# Patient Record
Sex: Female | Born: 1965
Health system: Southern US, Community
[De-identification: ages and names within clinical notes are randomized; demographics above are authoritative.]

## PROBLEM LIST (undated history)

## (undated) DIAGNOSIS — G2581 Restless legs syndrome: Secondary | ICD-10-CM

## (undated) DIAGNOSIS — M1990 Unspecified osteoarthritis, unspecified site: Secondary | ICD-10-CM

## (undated) DIAGNOSIS — C801 Malignant (primary) neoplasm, unspecified: Secondary | ICD-10-CM

## (undated) DIAGNOSIS — F419 Anxiety disorder, unspecified: Secondary | ICD-10-CM

## (undated) HISTORY — PX: ABDOMINAL HYSTERECTOMY: SHX81

## (undated) HISTORY — PX: TONSILLECTOMY: SUR1361

---

## 1997-12-15 ENCOUNTER — Other Ambulatory Visit: Admission: RE | Admit: 1997-12-15 | Discharge: 1997-12-15 | Payer: Self-pay | Admitting: *Deleted

## 1998-07-26 ENCOUNTER — Ambulatory Visit (HOSPITAL_COMMUNITY): Admission: RE | Admit: 1998-07-26 | Discharge: 1998-07-26 | Payer: Self-pay | Admitting: *Deleted

## 1998-07-26 ENCOUNTER — Encounter: Payer: Self-pay | Admitting: *Deleted

## 1998-08-10 ENCOUNTER — Ambulatory Visit (HOSPITAL_COMMUNITY): Admission: RE | Admit: 1998-08-10 | Discharge: 1998-08-10 | Payer: Self-pay | Admitting: *Deleted

## 1998-08-10 ENCOUNTER — Encounter: Payer: Self-pay | Admitting: *Deleted

## 1998-08-20 ENCOUNTER — Inpatient Hospital Stay (HOSPITAL_COMMUNITY): Admission: AD | Admit: 1998-08-20 | Discharge: 1998-08-20 | Payer: Self-pay | Admitting: Obstetrics and Gynecology

## 1998-08-29 ENCOUNTER — Emergency Department (HOSPITAL_COMMUNITY): Admission: EM | Admit: 1998-08-29 | Discharge: 1998-08-29 | Payer: Self-pay | Admitting: Emergency Medicine

## 1998-09-10 ENCOUNTER — Other Ambulatory Visit: Admission: RE | Admit: 1998-09-10 | Discharge: 1998-09-10 | Payer: Self-pay | Admitting: Obstetrics and Gynecology

## 1999-03-01 ENCOUNTER — Inpatient Hospital Stay (HOSPITAL_COMMUNITY): Admission: AD | Admit: 1999-03-01 | Discharge: 1999-03-01 | Payer: Self-pay | Admitting: Obstetrics and Gynecology

## 1999-03-04 ENCOUNTER — Inpatient Hospital Stay (HOSPITAL_COMMUNITY): Admission: AD | Admit: 1999-03-04 | Discharge: 1999-03-08 | Payer: Self-pay | Admitting: Obstetrics & Gynecology

## 1999-03-05 ENCOUNTER — Encounter: Payer: Self-pay | Admitting: Obstetrics & Gynecology

## 1999-03-10 ENCOUNTER — Inpatient Hospital Stay (HOSPITAL_COMMUNITY): Admission: AD | Admit: 1999-03-10 | Discharge: 1999-03-10 | Payer: Self-pay | Admitting: Obstetrics and Gynecology

## 1999-03-11 ENCOUNTER — Inpatient Hospital Stay (HOSPITAL_COMMUNITY): Admission: AD | Admit: 1999-03-11 | Discharge: 1999-03-11 | Payer: Self-pay | Admitting: *Deleted

## 1999-04-17 ENCOUNTER — Other Ambulatory Visit: Admission: RE | Admit: 1999-04-17 | Discharge: 1999-04-17 | Payer: Self-pay | Admitting: Obstetrics and Gynecology

## 2000-04-06 ENCOUNTER — Other Ambulatory Visit: Admission: RE | Admit: 2000-04-06 | Discharge: 2000-04-06 | Payer: Self-pay | Admitting: Obstetrics and Gynecology

## 2001-04-16 ENCOUNTER — Other Ambulatory Visit: Admission: RE | Admit: 2001-04-16 | Discharge: 2001-04-16 | Payer: Self-pay | Admitting: Obstetrics and Gynecology

## 2001-05-17 ENCOUNTER — Other Ambulatory Visit: Admission: RE | Admit: 2001-05-17 | Discharge: 2001-05-17 | Payer: Self-pay | Admitting: Obstetrics and Gynecology

## 2002-04-18 ENCOUNTER — Other Ambulatory Visit: Admission: RE | Admit: 2002-04-18 | Discharge: 2002-04-18 | Payer: Self-pay | Admitting: Obstetrics and Gynecology

## 2002-10-27 ENCOUNTER — Encounter: Admission: RE | Admit: 2002-10-27 | Discharge: 2002-10-27 | Payer: Self-pay | Admitting: Family Medicine

## 2002-10-27 ENCOUNTER — Encounter: Payer: Self-pay | Admitting: Family Medicine

## 2003-09-21 ENCOUNTER — Other Ambulatory Visit: Admission: RE | Admit: 2003-09-21 | Discharge: 2003-09-21 | Payer: Self-pay | Admitting: Obstetrics and Gynecology

## 2004-02-25 ENCOUNTER — Emergency Department (HOSPITAL_COMMUNITY): Admission: EM | Admit: 2004-02-25 | Discharge: 2004-02-25 | Payer: Self-pay | Admitting: Family Medicine

## 2004-02-25 ENCOUNTER — Ambulatory Visit (HOSPITAL_COMMUNITY): Admission: RE | Admit: 2004-02-25 | Discharge: 2004-02-25 | Payer: Self-pay | Admitting: Family Medicine

## 2004-03-13 ENCOUNTER — Other Ambulatory Visit: Admission: RE | Admit: 2004-03-13 | Discharge: 2004-03-13 | Payer: Self-pay | Admitting: Obstetrics and Gynecology

## 2004-09-06 ENCOUNTER — Other Ambulatory Visit: Admission: RE | Admit: 2004-09-06 | Discharge: 2004-09-06 | Payer: Self-pay | Admitting: Obstetrics and Gynecology

## 2005-03-11 ENCOUNTER — Other Ambulatory Visit: Admission: RE | Admit: 2005-03-11 | Discharge: 2005-03-11 | Payer: Self-pay | Admitting: Obstetrics and Gynecology

## 2005-10-15 ENCOUNTER — Encounter (INDEPENDENT_AMBULATORY_CARE_PROVIDER_SITE_OTHER): Payer: Self-pay | Admitting: *Deleted

## 2005-10-15 ENCOUNTER — Ambulatory Visit (HOSPITAL_BASED_OUTPATIENT_CLINIC_OR_DEPARTMENT_OTHER): Admission: RE | Admit: 2005-10-15 | Discharge: 2005-10-15 | Payer: Self-pay | Admitting: Obstetrics and Gynecology

## 2005-12-05 ENCOUNTER — Encounter: Admission: RE | Admit: 2005-12-05 | Discharge: 2005-12-05 | Payer: Self-pay | Admitting: Emergency Medicine

## 2007-04-29 ENCOUNTER — Encounter: Admission: RE | Admit: 2007-04-29 | Discharge: 2007-04-29 | Payer: Self-pay | Admitting: Obstetrics and Gynecology

## 2008-03-31 ENCOUNTER — Emergency Department (HOSPITAL_COMMUNITY): Admission: EM | Admit: 2008-03-31 | Discharge: 2008-03-31 | Payer: Self-pay | Admitting: Primary Care

## 2008-03-31 IMAGING — CR DG RIBS 2V*L*
3 series · 3 of 3 positions shown · non-contrast
Comparison: Chest radiograph same date

CLINICAL DATA: Left-sided chest pain

LEFT RIBS - 2 VIEW

[w ribs ap/pa upper left]
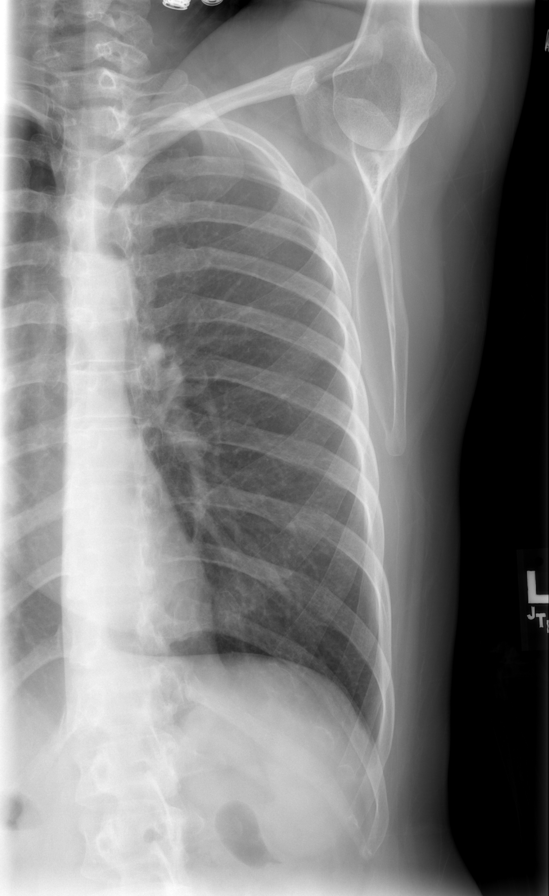

[w ribs ap/pa lower left]
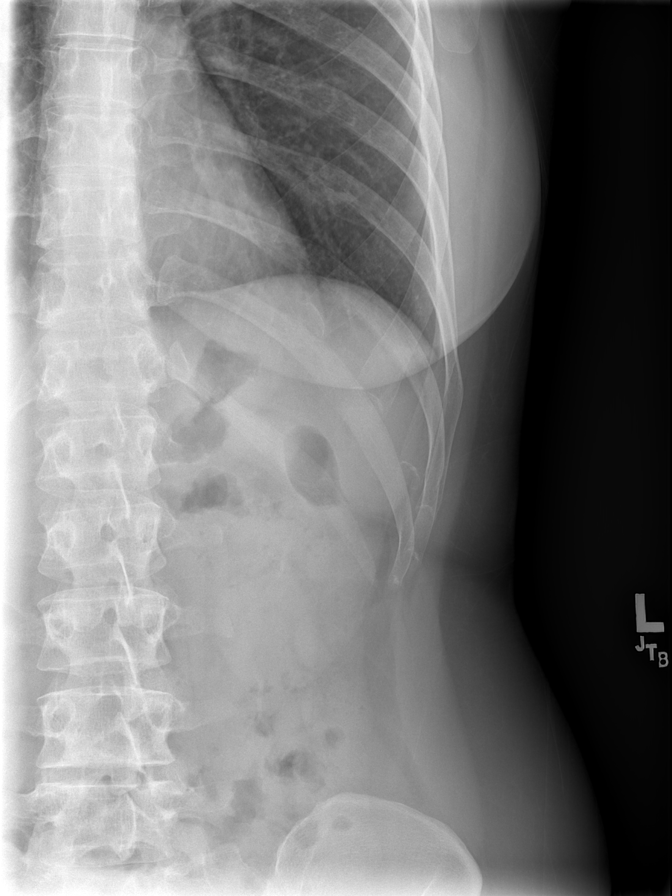

[w ribs oblique left]
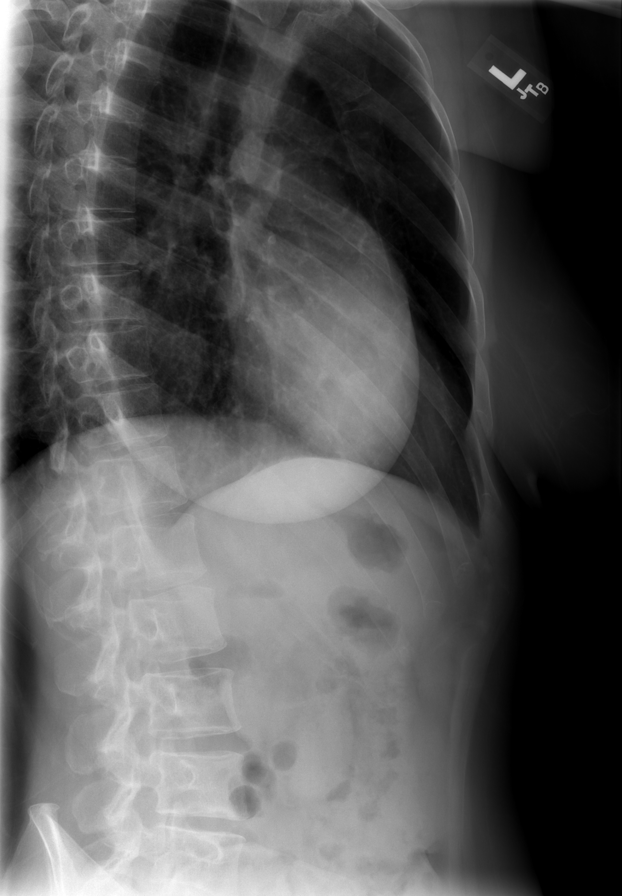

[3 of 3 positions shown; findings below may reference images not displayed]

FINDINGS: The visualized ribs are intact.  No fractures identified.
The lungs are clear.
IMPRESSION: Negative for fracture.

## 2008-08-07 ENCOUNTER — Ambulatory Visit: Payer: Self-pay | Admitting: Internal Medicine

## 2008-08-23 ENCOUNTER — Ambulatory Visit: Payer: Self-pay | Admitting: Gastroenterology

## 2008-08-30 ENCOUNTER — Emergency Department (HOSPITAL_COMMUNITY): Admission: EM | Admit: 2008-08-30 | Discharge: 2008-08-30 | Payer: Self-pay | Admitting: Family Medicine

## 2008-09-15 ENCOUNTER — Emergency Department (HOSPITAL_COMMUNITY): Admission: EM | Admit: 2008-09-15 | Discharge: 2008-09-15 | Payer: Self-pay | Admitting: Emergency Medicine

## 2008-09-21 ENCOUNTER — Ambulatory Visit: Payer: Self-pay | Admitting: Internal Medicine

## 2010-06-02 LAB — POCT RAPID STREP A (OFFICE): Streptococcus, Group A Screen (Direct): NEGATIVE

## 2010-06-11 LAB — D-DIMER, QUANTITATIVE: D-Dimer, Quant: 0.29 ug/mL-FEU (ref 0.00–0.48)

## 2010-07-12 NOTE — Op Note (Signed)
Deborah Martin, Deborah Martin              ACCOUNT NO.:  [id number]   MEDICAL RECORD NO.:  [id number]          PATIENT TYPE:  AMB   LOCATION:  [facility]                         FACILITY:  [facility]   PHYSICIAN:  Malva Limes, M.D.    DATE OF BIRTH:  [birth year]   DATE OF PROCEDURE:  10/15/2005  DATE OF DISCHARGE:                                 OPERATIVE REPORT   PREOPERATIVE DIAGNOSES:  1. Intrauterine pregnancy at 6.5 weeks estimated gestational age.  2. The patient desires termination.   POSTOPERATIVE DIAGNOSES:  1. Intrauterine pregnancy at 6.5 weeks estimated gestational age.  2. The patient desires termination.   PROCEDURE:  Dilation and evacuation.   SURGEON:  Malva Limes, M.D.   ANESTHESIA:  MAC with paracervical block.   DRAINS:  None.   ANTIBIOTICS:  Ancef 1 g.   ESTIMATED BLOOD LOSS:  Minimal.   SPECIMENS:  Products of conception sent to Pathology.   COMPLICATIONS:  None.   DESCRIPTION OF PROCEDURE:  The patient was taken to the operating room,  where a MAC anesthesia was administered without difficulty.  She was then  placed in dorsal lithotomy position.  She was prepped with Betadine and  draped in the usual fashion for this procedure.  A sterile speculum was  placed in the vagina.  The patient had 20 cc of 1% lidocaine injected as a  paracervical block.  A single-toothed tenaculum was applied to the anterior  cervical lip.  The cervical os was serially dilated to a 56 [place].  A 7 mm  suction cannula was placed into the uterine cavity and products of  conception withdrawn.  Sharp curettage was then performed, followed by  repeat suction.  The patient tolerated the procedure well.  She was taken to  the recovery room in stable condition.   The patient's blood type is Rh-positive and, therefore, no RhoGAM is  indicated.   The patient will be discharged to home with Darvocet to take p.r.n.  She  will follow up in the office in four weeks.     ______________________________  Malva Limes, M.D.     MA/MEDQ  D:  10/15/2005  T:  10/15/2005  Job:  [id number]   cc:   Guy Sandifer. Henderson Cloud, M.D.  Fax: [phone]

## 2012-05-13 ENCOUNTER — Other Ambulatory Visit: Payer: Self-pay | Admitting: Dermatology

## 2012-08-26 ENCOUNTER — Other Ambulatory Visit: Payer: Self-pay | Admitting: Obstetrics and Gynecology

## 2012-08-26 DIAGNOSIS — N63 Unspecified lump in unspecified breast: Secondary | ICD-10-CM

## 2012-09-07 ENCOUNTER — Ambulatory Visit
Admission: RE | Admit: 2012-09-07 | Discharge: 2012-09-07 | Disposition: A | Discharge status: Home / Self Care | Payer: 59 | Source: Ambulatory Visit | Attending: Obstetrics and Gynecology | Admitting: Obstetrics and Gynecology

## 2012-09-07 DIAGNOSIS — N63 Unspecified lump in unspecified breast: Secondary | ICD-10-CM

## 2013-06-27 ENCOUNTER — Other Ambulatory Visit: Payer: Self-pay | Admitting: Gastroenterology

## 2013-06-27 LAB — HM COLONOSCOPY

## 2013-07-29 ENCOUNTER — Other Ambulatory Visit: Payer: Self-pay | Admitting: Dermatology

## 2013-11-14 ENCOUNTER — Other Ambulatory Visit: Payer: Self-pay | Admitting: Obstetrics and Gynecology

## 2013-11-15 LAB — CYTOLOGY - PAP

## 2014-11-15 ENCOUNTER — Other Ambulatory Visit: Payer: Self-pay | Admitting: Obstetrics and Gynecology

## 2014-11-17 LAB — CYTOLOGY - PAP

## 2015-03-07 MED FILL — AZITHROMYCIN 250 MG TABLET: 250 | 5 days supply | Qty: 6 | Fill #0

## 2015-03-29 MED FILL — ALPRAZolam 0.5 MG TABS: 0.5 | 30 days supply | Qty: 30 | Fill #0

## 2015-04-11 DIAGNOSIS — H5213 Myopia, bilateral: Secondary | ICD-10-CM | POA: Diagnosis not present

## 2015-05-28 MED FILL — LARIN FE 1-20 TABLET: 1-20 | 84 days supply | Qty: 84 | Fill #1

## 2015-05-31 DIAGNOSIS — Z0000 Encounter for general adult medical examination without abnormal findings: Secondary | ICD-10-CM | POA: Diagnosis not present

## 2015-05-31 DIAGNOSIS — M25569 Pain in unspecified knee: Secondary | ICD-10-CM | POA: Diagnosis not present

## 2015-05-31 DIAGNOSIS — M542 Cervicalgia: Secondary | ICD-10-CM | POA: Diagnosis not present

## 2015-05-31 DIAGNOSIS — Z13220 Encounter for screening for lipoid disorders: Secondary | ICD-10-CM | POA: Diagnosis not present

## 2015-05-31 DIAGNOSIS — Z131 Encounter for screening for diabetes mellitus: Secondary | ICD-10-CM | POA: Diagnosis not present

## 2015-05-31 DIAGNOSIS — Z23 Encounter for immunization: Secondary | ICD-10-CM | POA: Diagnosis not present

## 2015-08-20 MED FILL — NORETHIN-ESTRAD-FERR 1-0.02: 1-20 | 84 days supply | Qty: 84 | Fill #2

## 2015-11-12 MED FILL — NORETHIN-ESTRAD-FERR 1-0.02: 1-20 | 84 days supply | Qty: 84 | Fill #3

## 2015-11-16 DIAGNOSIS — L814 Other melanin hyperpigmentation: Secondary | ICD-10-CM | POA: Diagnosis not present

## 2015-11-16 DIAGNOSIS — D1801 Hemangioma of skin and subcutaneous tissue: Secondary | ICD-10-CM | POA: Diagnosis not present

## 2015-11-16 DIAGNOSIS — D2261 Melanocytic nevi of right upper limb, including shoulder: Secondary | ICD-10-CM | POA: Diagnosis not present

## 2015-11-16 DIAGNOSIS — D2262 Melanocytic nevi of left upper limb, including shoulder: Secondary | ICD-10-CM | POA: Diagnosis not present

## 2015-11-16 DIAGNOSIS — D225 Melanocytic nevi of trunk: Secondary | ICD-10-CM | POA: Diagnosis not present

## 2015-11-22 DIAGNOSIS — Z01419 Encounter for gynecological examination (general) (routine) without abnormal findings: Secondary | ICD-10-CM | POA: Diagnosis not present

## 2015-11-22 DIAGNOSIS — Z6830 Body mass index (BMI) 30.0-30.9, adult: Secondary | ICD-10-CM | POA: Diagnosis not present

## 2015-12-06 ENCOUNTER — Other Ambulatory Visit: Payer: Self-pay | Admitting: Emergency Medicine

## 2015-12-06 NOTE — Telephone Encounter (Signed)
Pt has had no visits here and no future visits, lm at pharmacy refill denied, contact her pcp or have her establish here.

## 2015-12-07 MED FILL — ALPRAZolam 0.5 MG TABS: 0.5 | 30 days supply | Qty: 30 | Fill #0

## 2016-01-22 DIAGNOSIS — Z1231 Encounter for screening mammogram for malignant neoplasm of breast: Secondary | ICD-10-CM | POA: Diagnosis not present

## 2016-02-26 NOTE — Patient Instructions (Signed)
Your procedure is scheduled on:  Thursday, Jan. 11, 2018  Enter through the [employer] [facility] at:  6:00 AM  Pick up the phone at the desk and dial [phone].  Call this number if you have problems the morning of surgery: [phone].  Remember: Do NOT eat food or drink after:  Midnight Wednesday  Take these medicines the morning of surgery with a SIP OF WATER:  None  Stop ALL herbal medications at this time   Do NOT wear jewelry (body piercing), metal hair clips/bobby pins, make-up, or nail polish. Do NOT wear lotions, powders, or perfumes.  You may wear deodorant. Do NOT shave for 48 hours prior to surgery. Do NOT bring valuables to the hospital. Contacts, dentures, or bridgework may not be worn into surgery.  Leave suitcase in car.  After surgery it may be brought to your room.  For patients admitted to the hospital, checkout time is 11:00 AM the day of discharge.

## 2016-02-27 ENCOUNTER — Encounter (HOSPITAL_COMMUNITY)
Admission: RE | Admit: 2016-02-27 | Discharge: 2016-02-27 | Disposition: A | Discharge status: Home / Self Care | Payer: 59 | Source: Ambulatory Visit | Attending: Obstetrics and Gynecology | Admitting: Obstetrics and Gynecology

## 2016-02-27 ENCOUNTER — Encounter (HOSPITAL_COMMUNITY): Payer: Self-pay

## 2016-02-27 DIAGNOSIS — Z8041 Family history of malignant neoplasm of ovary: Secondary | ICD-10-CM | POA: Diagnosis not present

## 2016-02-27 DIAGNOSIS — Z01818 Encounter for other preprocedural examination: Secondary | ICD-10-CM | POA: Diagnosis not present

## 2016-02-27 HISTORY — DX: Anxiety disorder, unspecified: F41.9

## 2016-02-27 HISTORY — DX: Restless legs syndrome: G25.81

## 2016-02-27 HISTORY — DX: Unspecified osteoarthritis, unspecified site: M19.90

## 2016-02-27 LAB — CBC
HEMATOCRIT: 38.6 % (ref 36.0–46.0)
HEMOGLOBIN: 13.4 g/dL (ref 12.0–15.0)
MCH: 29.9 pg (ref 26.0–34.0)
MCHC: 34.7 g/dL (ref 30.0–36.0)
MCV: 86.2 fL (ref 78.0–100.0)
Platelets: 273 10*3/uL (ref 150–400)
RBC: 4.48 MIL/uL (ref 3.87–5.11)
RDW: 12.7 % (ref 11.5–15.5)
WBC: 9.9 10*3/uL (ref 4.0–10.5)

## 2016-02-27 LAB — TYPE AND SCREEN
ABO/RH(D): A POS
Antibody Screen: NEGATIVE

## 2016-02-27 LAB — ABO/RH: ABO/RH(D): A POS

## 2016-03-05 NOTE — Anesthesia Preprocedure Evaluation (Addendum)
Anesthesia Evaluation  Patient identified by MRN, date of birth, ID band Patient awake    Reviewed: Allergy & Precautions, NPO status , Patient's Chart, lab work & pertinent test results  Airway Mallampati: II  TM Distance: >3 FB Neck ROM: Full    Dental  (+) Teeth Intact, Dental Advisory Given   Pulmonary neg pulmonary ROS,    breath sounds clear to auscultation       Cardiovascular negative cardio ROS   Rhythm:Regular Rate:Normal     Neuro/Psych Anxiety negative neurological ROS  negative psych ROS   GI/Hepatic negative GI ROS, Neg liver ROS,   Endo/Other  negative endocrine ROS  Renal/GU negative Renal ROS  negative genitourinary   Musculoskeletal  (+) Arthritis , Osteoarthritis,    Abdominal   Peds negative pediatric ROS (+)  Hematology negative hematology ROS (+)   Anesthesia Other Findings   Reproductive/Obstetrics negative OB ROS                            Lab Results  Component Value Date   WBC 9.9 02/27/2016   HGB 13.4 02/27/2016   HCT 38.6 02/27/2016   MCV 86.2 02/27/2016   PLT 273 02/27/2016   No results found for: INR, PROTIME  Anesthesia Physical Anesthesia Plan  ASA: I  Anesthesia Plan: General   Post-op Pain Management:    Induction: Intravenous  Airway Management Planned: Oral ETT  Additional Equipment:   Intra-op Plan:   Post-operative Plan: Extubation in OR  Informed Consent: I have reviewed the patients History and Physical, chart, labs and discussed the procedure including the risks, benefits and alternatives for the proposed anesthesia with the patient or authorized representative who has indicated his/her understanding and acceptance.     Plan Discussed with: CRNA  Anesthesia Plan Comments:        Anesthesia Quick Evaluation

## 2016-03-06 ENCOUNTER — Encounter (HOSPITAL_COMMUNITY)
Admission: RE | Disposition: A | Discharge status: Home / Self Care | Payer: Self-pay | Source: Ambulatory Visit | Attending: Obstetrics and Gynecology

## 2016-03-06 ENCOUNTER — Observation Stay (HOSPITAL_COMMUNITY)
Admission: RE | Admit: 2016-03-06 | Discharge: 2016-03-07 | Disposition: A | Discharge status: Home / Self Care | Payer: 59 | Source: Ambulatory Visit | Attending: Obstetrics and Gynecology | Admitting: Obstetrics and Gynecology

## 2016-03-06 ENCOUNTER — Encounter (HOSPITAL_COMMUNITY): Payer: Self-pay

## 2016-03-06 ENCOUNTER — Ambulatory Visit (HOSPITAL_COMMUNITY): Payer: 59 | Admitting: Anesthesiology

## 2016-03-06 DIAGNOSIS — D270 Benign neoplasm of right ovary: Secondary | ICD-10-CM | POA: Insufficient documentation

## 2016-03-06 DIAGNOSIS — M47816 Spondylosis without myelopathy or radiculopathy, lumbar region: Secondary | ICD-10-CM | POA: Insufficient documentation

## 2016-03-06 DIAGNOSIS — Z8041 Family history of malignant neoplasm of ovary: Secondary | ICD-10-CM | POA: Insufficient documentation

## 2016-03-06 DIAGNOSIS — Z90710 Acquired absence of both cervix and uterus: Secondary | ICD-10-CM | POA: Diagnosis not present

## 2016-03-06 DIAGNOSIS — N888 Other specified noninflammatory disorders of cervix uteri: Secondary | ICD-10-CM | POA: Insufficient documentation

## 2016-03-06 DIAGNOSIS — N838 Other noninflammatory disorders of ovary, fallopian tube and broad ligament: Secondary | ICD-10-CM | POA: Diagnosis not present

## 2016-03-06 DIAGNOSIS — N8302 Follicular cyst of left ovary: Secondary | ICD-10-CM | POA: Insufficient documentation

## 2016-03-06 DIAGNOSIS — N739 Female pelvic inflammatory disease, unspecified: Secondary | ICD-10-CM | POA: Diagnosis not present

## 2016-03-06 DIAGNOSIS — F419 Anxiety disorder, unspecified: Secondary | ICD-10-CM | POA: Diagnosis not present

## 2016-03-06 DIAGNOSIS — Z4002 Encounter for prophylactic removal of ovary: Principal | ICD-10-CM | POA: Insufficient documentation

## 2016-03-06 DIAGNOSIS — D259 Leiomyoma of uterus, unspecified: Secondary | ICD-10-CM | POA: Diagnosis not present

## 2016-03-06 DIAGNOSIS — G2581 Restless legs syndrome: Secondary | ICD-10-CM | POA: Insufficient documentation

## 2016-03-06 DIAGNOSIS — R102 Pelvic and perineal pain: Secondary | ICD-10-CM | POA: Diagnosis not present

## 2016-03-06 HISTORY — PX: LAPAROSCOPIC VAGINAL HYSTERECTOMY WITH SALPINGO OOPHORECTOMY: SHX6681

## 2016-03-06 LAB — HCG, SERUM, QUALITATIVE: Preg, Serum: NEGATIVE

## 2016-03-06 SURGERY — HYSTERECTOMY, VAGINAL, LAPAROSCOPY-ASSISTED, WITH SALPINGO-OOPHORECTOMY
Anesthesia: General | Site: Abdomen | Laterality: Bilateral

## 2016-03-06 MED ORDER — DEXAMETHASONE SODIUM PHOSPHATE 10 MG/ML IJ SOLN
INTRAMUSCULAR | Status: AC
  Filled 2016-03-06: qty 1

## 2016-03-06 MED ORDER — DEXAMETHASONE SODIUM PHOSPHATE 10 MG/ML IJ SOLN
INTRAMUSCULAR | Status: DC | PRN
  Administered 2016-03-06: 10 mg via INTRAVENOUS

## 2016-03-06 MED ORDER — OXYCODONE HCL 5 MG PO TABS: 5.0000 mg | ORAL_TABLET | Freq: Once | ORAL | Status: DC | PRN

## 2016-03-06 MED ORDER — DIPHENHYDRAMINE HCL 50 MG/ML IJ SOLN: 12.5000 mg | Freq: Four times a day (QID) | INTRAMUSCULAR | Status: DC | PRN

## 2016-03-06 MED ORDER — IBUPROFEN 600 MG PO TABS
600.0000 mg | ORAL_TABLET | Freq: Four times a day (QID) | ORAL | Status: DC | PRN
  Administered 2016-03-07: 600 mg via ORAL
  Filled 2016-03-06: qty 1

## 2016-03-06 MED ORDER — PROPOFOL 10 MG/ML IV BOLUS
INTRAVENOUS | Status: AC
  Filled 2016-03-06: qty 20

## 2016-03-06 MED ORDER — PROMETHAZINE HCL 25 MG/ML IJ SOLN
INTRAMUSCULAR | Status: AC
  Filled 2016-03-06: qty 1

## 2016-03-06 MED ORDER — LACTATED RINGERS IV SOLN
INTRAVENOUS | Status: DC
  Administered 2016-03-06: 08:00:00 via INTRAVENOUS
  Administered 2016-03-06: 125 mL via INTRAVENOUS

## 2016-03-06 MED ORDER — LACTATED RINGERS IV SOLN
INTRAVENOUS | Status: DC
  Administered 2016-03-06 – 2016-03-07 (×3): via INTRAVENOUS

## 2016-03-06 MED ORDER — PROMETHAZINE HCL 25 MG/ML IJ SOLN
6.2500 mg | INTRAMUSCULAR | Status: DC | PRN
  Administered 2016-03-06: 6.25 mg via INTRAVENOUS

## 2016-03-06 MED ORDER — DIPHENHYDRAMINE HCL 12.5 MG/5ML PO ELIX
12.5000 mg | ORAL_SOLUTION | Freq: Four times a day (QID) | ORAL | Status: DC | PRN
  Filled 2016-03-06: qty 5

## 2016-03-06 MED ORDER — OXYCODONE-ACETAMINOPHEN 5-325 MG PO TABS
1.0000 | ORAL_TABLET | ORAL | Status: DC | PRN
  Administered 2016-03-06: 1 via ORAL
  Administered 2016-03-07: 2 via ORAL
  Administered 2016-03-07: 1 via ORAL
  Administered 2016-03-07: 2 via ORAL
  Filled 2016-03-06: qty 1
  Filled 2016-03-06 (×2): qty 2
  Filled 2016-03-06 (×2): qty 1

## 2016-03-06 MED ORDER — MIDAZOLAM HCL 2 MG/2ML IJ SOLN
INTRAMUSCULAR | Status: AC
  Filled 2016-03-06: qty 2

## 2016-03-06 MED ORDER — BUPIVACAINE HCL (PF) 0.5 % IJ SOLN
INTRAMUSCULAR | Status: AC
  Filled 2016-03-06: qty 30

## 2016-03-06 MED ORDER — LACTATED RINGERS IR SOLN
Status: DC | PRN
  Administered 2016-03-06: 3000 mL

## 2016-03-06 MED ORDER — FENTANYL CITRATE (PF) 100 MCG/2ML IJ SOLN
INTRAMUSCULAR | Status: DC | PRN
  Administered 2016-03-06: 100 ug via INTRAVENOUS
  Administered 2016-03-06: 150 ug via INTRAVENOUS

## 2016-03-06 MED ORDER — SCOPOLAMINE 1 MG/3DAYS TD PT72
1.0000 | MEDICATED_PATCH | Freq: Once | TRANSDERMAL | Status: DC
  Administered 2016-03-06: 1.5 mg via TRANSDERMAL

## 2016-03-06 MED ORDER — FENTANYL CITRATE (PF) 100 MCG/2ML IJ SOLN
INTRAMUSCULAR | Status: AC
  Filled 2016-03-06: qty 2

## 2016-03-06 MED ORDER — SODIUM CHLORIDE 0.9 % IJ SOLN
INTRAMUSCULAR | Status: DC | PRN
  Administered 2016-03-06: 10 mL

## 2016-03-06 MED ORDER — MAGNESIUM HYDROXIDE 400 MG/5ML PO SUSP: 30.0000 mL | Freq: Every day | ORAL | Status: DC | PRN

## 2016-03-06 MED ORDER — ROCURONIUM BROMIDE 100 MG/10ML IV SOLN
INTRAVENOUS | Status: DC | PRN
  Administered 2016-03-06: 50 mg via INTRAVENOUS

## 2016-03-06 MED ORDER — HYDROMORPHONE HCL 1 MG/ML IJ SOLN
INTRAMUSCULAR | Status: AC
  Filled 2016-03-06: qty 1

## 2016-03-06 MED ORDER — MIDAZOLAM HCL 2 MG/2ML IJ SOLN
INTRAMUSCULAR | Status: DC | PRN
  Administered 2016-03-06: 2 mg via INTRAVENOUS

## 2016-03-06 MED ORDER — SUGAMMADEX SODIUM 200 MG/2ML IV SOLN
INTRAVENOUS | Status: AC
  Filled 2016-03-06: qty 2

## 2016-03-06 MED ORDER — LIDOCAINE HCL (CARDIAC) 20 MG/ML IV SOLN
INTRAVENOUS | Status: AC
Start: 2016-03-06 — End: 2016-03-06
  Filled 2016-03-06: qty 5

## 2016-03-06 MED ORDER — SENNA 8.6 MG PO TABS
1.0000 | ORAL_TABLET | Freq: Two times a day (BID) | ORAL | Status: DC
  Administered 2016-03-06 – 2016-03-07 (×2): 8.6 mg via ORAL
  Filled 2016-03-06 (×3): qty 1

## 2016-03-06 MED ORDER — SCOPOLAMINE 1 MG/3DAYS TD PT72
MEDICATED_PATCH | TRANSDERMAL | Status: AC
  Administered 2016-03-06: 1.5 mg via TRANSDERMAL
  Filled 2016-03-06: qty 1

## 2016-03-06 MED ORDER — ONDANSETRON HCL 4 MG/2ML IJ SOLN: 4.0000 mg | Freq: Four times a day (QID) | INTRAMUSCULAR | Status: DC | PRN

## 2016-03-06 MED ORDER — ROCURONIUM BROMIDE 100 MG/10ML IV SOLN
INTRAVENOUS | Status: AC
Start: 2016-03-06 — End: 2016-03-06
  Filled 2016-03-06: qty 1

## 2016-03-06 MED ORDER — PROPOFOL 10 MG/ML IV BOLUS
INTRAVENOUS | Status: DC | PRN
  Administered 2016-03-06: 200 mg via INTRAVENOUS

## 2016-03-06 MED ORDER — LIDOCAINE HCL (CARDIAC) 20 MG/ML IV SOLN
INTRAVENOUS | Status: DC | PRN
  Administered 2016-03-06: 30 mg via INTRAVENOUS

## 2016-03-06 MED ORDER — CEFAZOLIN SODIUM-DEXTROSE 2-4 GM/100ML-% IV SOLN
2.0000 g | INTRAVENOUS | Status: AC
  Administered 2016-03-06: 2 g via INTRAVENOUS

## 2016-03-06 MED ORDER — ALUM & MAG HYDROXIDE-SIMETH 200-200-20 MG/5ML PO SUSP: 30.0000 mL | ORAL | Status: DC | PRN

## 2016-03-06 MED ORDER — ONDANSETRON HCL 4 MG/2ML IJ SOLN
INTRAMUSCULAR | Status: DC | PRN
Start: 2016-03-06 — End: 2016-03-06
  Administered 2016-03-06: 4 mg via INTRAVENOUS

## 2016-03-06 MED ORDER — ZOLPIDEM TARTRATE 5 MG PO TABS: 5.0000 mg | ORAL_TABLET | Freq: Every evening | ORAL | Status: DC | PRN

## 2016-03-06 MED ORDER — ONDANSETRON HCL 4 MG/2ML IJ SOLN
INTRAMUSCULAR | Status: AC
  Filled 2016-03-06: qty 2

## 2016-03-06 MED ORDER — LACTATED RINGERS IV SOLN: INTRAVENOUS | Status: DC

## 2016-03-06 MED ORDER — SODIUM CHLORIDE 0.9% FLUSH: 9.0000 mL | INTRAVENOUS | Status: DC | PRN

## 2016-03-06 MED ORDER — ONDANSETRON HCL 4 MG PO TABS: 4.0000 mg | ORAL_TABLET | Freq: Four times a day (QID) | ORAL | Status: DC | PRN

## 2016-03-06 MED ORDER — ACETAMINOPHEN 10 MG/ML IV SOLN
1000.0000 mg | Freq: Once | INTRAVENOUS | Status: DC
  Filled 2016-03-06: qty 100

## 2016-03-06 MED ORDER — CEFAZOLIN SODIUM-DEXTROSE 2-4 GM/100ML-% IV SOLN: 2.0000 g | INTRAVENOUS | Status: DC

## 2016-03-06 MED ORDER — HYDROMORPHONE HCL 1 MG/ML IJ SOLN
0.2500 mg | INTRAMUSCULAR | Status: DC | PRN
  Administered 2016-03-06 (×2): 0.5 mg via INTRAVENOUS

## 2016-03-06 MED ORDER — KETOROLAC TROMETHAMINE 30 MG/ML IJ SOLN
INTRAMUSCULAR | Status: AC
  Filled 2016-03-06: qty 1

## 2016-03-06 MED ORDER — MEPERIDINE HCL 25 MG/ML IJ SOLN: 6.2500 mg | INTRAMUSCULAR | Status: DC | PRN

## 2016-03-06 MED ORDER — BUPIVACAINE HCL (PF) 0.5 % IJ SOLN
INTRAMUSCULAR | Status: DC | PRN
  Administered 2016-03-06: 25 mL
  Administered 2016-03-06: 5 mL

## 2016-03-06 MED ORDER — OXYCODONE HCL 5 MG/5ML PO SOLN: 5.0000 mg | Freq: Once | ORAL | Status: DC | PRN

## 2016-03-06 MED ORDER — HYDROMORPHONE 1 MG/ML IV SOLN
INTRAVENOUS | Status: DC
  Administered 2016-03-06: 4 mg via INTRAVENOUS
  Administered 2016-03-06: 1.8 mL via INTRAVENOUS
  Administered 2016-03-06: 11:00:00 via INTRAVENOUS
  Administered 2016-03-06: 1.4 mL via INTRAVENOUS
  Filled 2016-03-06: qty 25

## 2016-03-06 MED ORDER — FENTANYL CITRATE (PF) 250 MCG/5ML IJ SOLN
INTRAMUSCULAR | Status: AC
  Filled 2016-03-06: qty 5

## 2016-03-06 MED ORDER — ACETAMINOPHEN 10 MG/ML IV SOLN
INTRAVENOUS | Status: DC | PRN
  Administered 2016-03-06: 1000 mg via INTRAVENOUS

## 2016-03-06 MED ORDER — NALOXONE HCL 0.4 MG/ML IJ SOLN: 0.4000 mg | INTRAMUSCULAR | Status: DC | PRN

## 2016-03-06 MED ORDER — SUGAMMADEX SODIUM 200 MG/2ML IV SOLN
INTRAVENOUS | Status: DC | PRN
  Administered 2016-03-06: 200 mg via INTRAVENOUS

## 2016-03-06 SURGICAL SUPPLY — 53 items
ADH SKN CLS APL DERMABOND .7 (GAUZE/BANDAGES/DRESSINGS) ×1
APL SRG 38 LTWT LNG FL B (MISCELLANEOUS) ×1
APPLICATOR ARISTA FLEXITIP XL (MISCELLANEOUS) ×3 IMPLANT
CABLE HIGH FREQUENCY MONO STRZ (ELECTRODE) IMPLANT
CATH ROBINSON RED A/P 16FR (CATHETERS) ×3 IMPLANT
CLOTH BEACON ORANGE TIMEOUT ST (SAFETY) ×3 IMPLANT
CONT PATH 16OZ SNAP LID 3702 (MISCELLANEOUS) ×3 IMPLANT
COVER BACK TABLE 60X90IN (DRAPES) ×3 IMPLANT
DECANTER SPIKE VIAL GLASS SM (MISCELLANEOUS) ×3 IMPLANT
DERMABOND ADVANCED (GAUZE/BANDAGES/DRESSINGS) ×2
DERMABOND ADVANCED .7 DNX12 (GAUZE/BANDAGES/DRESSINGS) ×1 IMPLANT
DRSG OPSITE POSTOP 3X4 (GAUZE/BANDAGES/DRESSINGS) ×3 IMPLANT
DURAPREP 26ML APPLICATOR (WOUND CARE) ×3 IMPLANT
ELECT LIGASURE LONG (ELECTRODE) ×3 IMPLANT
ELECT REM PT RETURN 9FT ADLT (ELECTROSURGICAL) ×3
ELECTRODE REM PT RTRN 9FT ADLT (ELECTROSURGICAL) IMPLANT
FILTER SMOKE EVAC LAPAROSHD (FILTER) ×3 IMPLANT
GLOVE BIO SURGEON STRL SZ8 (GLOVE) ×6 IMPLANT
GLOVE BIOGEL PI IND STRL 6.5 (GLOVE) ×1 IMPLANT
GLOVE BIOGEL PI IND STRL 7.0 (GLOVE) ×1 IMPLANT
GLOVE BIOGEL PI IND STRL 8 (GLOVE) ×1 IMPLANT
GLOVE BIOGEL PI INDICATOR 6.5 (GLOVE) ×2
GLOVE BIOGEL PI INDICATOR 7.0 (GLOVE) ×2
GLOVE BIOGEL PI INDICATOR 8 (GLOVE) ×2
GOWN STRL REUS W/ TWL XL LVL3 (GOWN DISPOSABLE) ×2 IMPLANT
GOWN STRL REUS W/TWL XL LVL3 (GOWN DISPOSABLE) ×6
HEMOSTAT ARISTA ABSORB 3G PWDR (MISCELLANEOUS) ×3 IMPLANT
LEGGING LITHOTOMY PAIR STRL (DRAPES) ×3 IMPLANT
NEEDLE INSUFFLATION 120MM (ENDOMECHANICALS) ×3 IMPLANT
NS IRRIG 1000ML POUR BTL (IV SOLUTION) ×3 IMPLANT
PACK LAVH (CUSTOM PROCEDURE TRAY) ×3 IMPLANT
PACK ROBOTIC GOWN (GOWN DISPOSABLE) ×3 IMPLANT
PACK TRENDGUARD 450 HYBRID PRO (MISCELLANEOUS) ×1 IMPLANT
PROTECTOR NERVE ULNAR (MISCELLANEOUS) ×6 IMPLANT
SCISSORS LAP 5X45 EPIX DISP (ENDOMECHANICALS) IMPLANT
SEALER TISSUE G2 CVD JAW 45CM (ENDOMECHANICALS) ×3 IMPLANT
SET CYSTO W/LG BORE CLAMP LF (SET/KITS/TRAYS/PACK) IMPLANT
SET IRRIG TUBING LAPAROSCOPIC (IRRIGATION / IRRIGATOR) ×3 IMPLANT
SLEEVE XCEL OPT CAN 5 100 (ENDOMECHANICALS) ×3 IMPLANT
SUT MNCRL 0 MO-4 VIOLET 18 CR (SUTURE) ×1 IMPLANT
SUT MNCRL 0 VIOLET 6X18 (SUTURE) ×1 IMPLANT
SUT MONOCRYL 0 6X18 (SUTURE) ×2
SUT MONOCRYL 0 MO 4 18  CR/8 (SUTURE) ×2
SUT VIC AB 4-0 PS2 27 (SUTURE) ×3 IMPLANT
SUT VICRYL 0 UR6 27IN ABS (SUTURE) ×3 IMPLANT
SYR 30ML LL (SYRINGE) ×3 IMPLANT
TOWEL OR 17X24 6PK STRL BLUE (TOWEL DISPOSABLE) ×6 IMPLANT
TRAY FOLEY CATH SILVER 14FR (SET/KITS/TRAYS/PACK) ×3 IMPLANT
TRENDGUARD 450 HYBRID PRO PACK (MISCELLANEOUS) ×3
TROCAR XCEL NON-BLD 11X100MML (ENDOMECHANICALS) ×3 IMPLANT
TROCAR XCEL NON-BLD 5MMX100MML (ENDOMECHANICALS) ×3 IMPLANT
WARMER LAPAROSCOPE (MISCELLANEOUS) ×3 IMPLANT
WATER STERILE IRR 1000ML POUR (IV SOLUTION) ×3 IMPLANT

## 2016-03-06 NOTE — Anesthesia Postprocedure Evaluation (Addendum)
Anesthesia Post Note  Patient: Deborah Martin  Procedure(s) Performed: Procedure(s) (LRB): LAPAROSCOPIC ASSISTED VAGINAL HYSTERECTOMY WITH BILATERAL SALPINGO OOPHORECTOMY (Bilateral)  Patient location during evaluation: [facility] Anesthesia Type: General Level of consciousness: awake and alert and oriented Pain management: pain level controlled Vital Signs Assessment: post-procedure vital signs reviewed and stable Respiratory status: spontaneous breathing, nonlabored ventilation, respiratory function stable and patient connected to nasal cannula oxygen Cardiovascular status: stable Postop Assessment: no signs of nausea or vomiting and adequate PO intake Anesthetic complications: no        Last Vitals:  Vitals:   03/06/16 1400 03/06/16 1450  BP: 115/64   Pulse: 63   Resp: 16 19  Temp: 36.8 C     Last Pain:  Vitals:   03/06/16 1450  TempSrc:   PainSc: 2    Pain Goal: Patients Stated Pain Goal: 4 (03/06/16 1100)               GREGORY,SUZANNE

## 2016-03-06 NOTE — Anesthesia Postprocedure Evaluation (Signed)
Anesthesia Post Note  Patient: RABECCA KLEPINGER  Procedure(s) Performed: Procedure(s) (LRB): LAPAROSCOPIC ASSISTED VAGINAL HYSTERECTOMY WITH BILATERAL SALPINGO OOPHORECTOMY (Bilateral)  Patient location during evaluation: PACU Anesthesia Type: General Level of consciousness: awake and alert Pain management: pain level controlled Vital Signs Assessment: post-procedure vital signs reviewed and stable Respiratory status: spontaneous breathing, nonlabored ventilation, respiratory function stable and patient connected to nasal cannula oxygen Cardiovascular status: blood pressure returned to baseline and stable Postop Assessment: no signs of nausea or vomiting Anesthetic complications: no        Last Vitals:  Vitals:   03/06/16 1045 03/06/16 1100  BP: 119/68 112/64  Pulse:  68  Resp:    Temp:  36.8 C    Last Pain:  Vitals:   03/06/16 1118  TempSrc:   PainSc: 3    Pain Goal: Patients Stated Pain Goal: 4 (03/06/16 1100)               Effie Berkshire

## 2016-03-06 NOTE — Brief Op Note (Signed)
03/06/2016  9:05 AM  PATIENT:  Deborah Martin  51 y.o. female  PRE-OPERATIVE DIAGNOSIS:  family history of ovarian cancer  POST-OPERATIVE DIAGNOSIS:  family history of ovarian cancer  PROCEDURE:  Procedure(s): LAPAROSCOPIC ASSISTED VAGINAL HYSTERECTOMY WITH BILATERAL SALPINGO OOPHORECTOMY (Bilateral)  SURGEON:  Surgeon(s) and Role:    * Everlene Farrier, MD - Primary    * Tyson Dense, MD - Assisting  PHYSICIAN ASSISTANT:   ASSISTANTS: none   ANESTHESIA:   general  EBL:  Total I/O In: 1000 [I.V.:1000] Out: 500 [Urine:400; Blood:100]  BLOOD ADMINISTERED:none  DRAINS: Urinary Catheter (Foley)   LOCAL MEDICATIONS USED:  MARCAINE    and Amount: 30 ml  SPECIMEN:  Source of Specimen:  uterus, bilateral tubes and ovaries  DISPOSITION OF SPECIMEN:  PATHOLOGY  COUNTS:  YES  TOURNIQUET:  * No tourniquets in log *  DICTATION: .Other Dictation: Dictation Number [id number]  PLAN OF CARE: Admit for overnight observation  PATIENT DISPOSITION:  PACU - hemodynamically stable.   Delay start of Pharmacological VTE agent (>24hrs) due to surgical blood loss or risk of bleeding: not applicable

## 2016-03-06 NOTE — Progress Notes (Signed)
Ambulating Tolerating regular diet  Vitals:   03/06/16 1607 03/06/16 1814  BP: 115/66   Pulse: 70   Resp: 16 12  Temp:     Lungs CTA Cor RRR Abdomen soft, good BS LE PAS on UO dilute, clear  A/P: stable         D/C PCA         Check post void residual volume when foley out

## 2016-03-06 NOTE — Anesthesia Procedure Notes (Signed)
Procedure Name: Intubation Date/Time: 03/06/2016 7:45 AM Performed by: Gilmer Mor R Pre-anesthesia Checklist: Patient identified, Patient being monitored, Timeout performed, Emergency Drugs available and Suction available Patient Re-evaluated:Patient Re-evaluated prior to inductionOxygen Delivery Method: Circle System Utilized Preoxygenation: Pre-oxygenation with 100% oxygen Intubation Type: IV induction Ventilation: Mask ventilation without difficulty Laryngoscope Size: Mac and 3 Grade View: Grade II Tube type: Oral Tube size: 7.0 mm Number of attempts: 1 Airway Equipment and Method: stylet Placement Confirmation: ETT inserted through vocal cords under direct vision,  positive ETCO2 and breath sounds checked- equal and bilateral Secured at: 21 cm Tube secured with: Tape Dental Injury: Teeth and Oropharynx as per pre-operative assessment

## 2016-03-06 NOTE — Addendum Note (Signed)
Addendum  created 03/06/16 1530 by Jonna Munro, CRNA   Sign clinical note

## 2016-03-06 NOTE — OR Nursing (Signed)
Pt has 2 ports. Umbilicus and supracervical. Unable to delete ports on chart.

## 2016-03-06 NOTE — Transfer of Care (Signed)
Immediate Anesthesia Transfer of Care Note  Patient: Deborah Martin  Procedure(s) Performed: Procedure(s): LAPAROSCOPIC ASSISTED VAGINAL HYSTERECTOMY WITH BILATERAL SALPINGO OOPHORECTOMY (Bilateral)  Patient Location: PACU  Anesthesia Type:General  Level of Consciousness: awake, alert , oriented and patient cooperative  Airway & Oxygen Therapy: Patient Spontanous Breathing and Patient connected to nasal cannula oxygen  Post-op Assessment: Report given to RN and Post -op Vital signs reviewed and stable  Post vital signs: Reviewed and stable  Last Vitals:  Vitals:   03/06/16 0614  BP: 121/87  Pulse: 78  Resp: 16  Temp: 36.9 C    Last Pain:  Vitals:   03/06/16 0614  TempSrc: Oral      Patients Stated Pain Goal: 4 ([id number])  Complications: No apparent anesthesia complications

## 2016-03-06 NOTE — H&P (Signed)
Deborah Martin is an 51 y.o. female. Fhx of ovarian cancer in mother at age 55.  Pertinent Gynecological History: Menses: flow is moderate Bleeding: N/A Contraception: abstinence DES exposure: denies Blood transfusions: none Sexually transmitted diseases: no past history Previous GYN Procedures: none  Last mammogram: normal Date: 2017 Last pap: normal Date: 2017 OB History: G2, P2   Menstrual History: Menarche age: unknown Patient's last menstrual period was 01/09/2016 (approximate).    Past Medical History:  Diagnosis Date  . Anxiety   . Arthritis    lower back  . Restless legs     Past Surgical History:  Procedure Laterality Date  . CESAREAN SECTION      History reviewed. No pertinent family history.  Social History:  reports that she has never smoked. She has never used smokeless tobacco. She reports that she drinks alcohol. She reports that she does not use drugs.  Allergies: No Known Allergies  Prescriptions Prior to Admission  Medication Sig Dispense Refill Last Dose  . ALPRAZolam (XANAX) 0.5 MG tablet Take 0.25 mg by mouth at bedtime as needed for sleep.   Past Month at Unknown time  . cetirizine (ZYRTEC) 10 MG tablet Take 10 mg by mouth daily as needed for allergies.   03/05/2016 at Unknown time  . Multiple Vitamin (MULTIVITAMIN WITH MINERALS) TABS tablet Take 1 tablet by mouth daily.   Past Week at Unknown time    Review of Systems  Constitutional: Negative for fever.    Blood pressure 121/87, pulse 78, temperature 98.5 F (36.9 C), temperature source Oral, resp. rate 16, last menstrual period 01/09/2016, SpO2 99 %. Physical Exam  Cardiovascular: Normal rate and regular rhythm.   Respiratory: Effort normal and breath sounds normal.  GI: Soft.  Genitourinary:  Genitourinary Comments: Uterus 6 weeks, mobile Adnexa without masses    Results for orders placed or performed during the hospital encounter of 03/06/16 (from the past 24 hour(s))  hCG,  serum, qualitative     Status: None   Collection Time: 03/06/16  6:06 AM  Result Value Ref Range   Preg, Serum NEGATIVE NEGATIVE    No results found.  Assessment/Plan: 51 yo G2P2 with Fhx of ovarian cancer Reviewed with patient procedure-LAVH/BSO Risks reviewed including infection, organ damage, bleeding/transfusion-HIV/Hep, DVT/PE, pneumonia, pelvic/abdominal pain, laparotomy, return to OR, painful interccourse. Patient states she understands and agrees  Joselynne Killam II,Grainger Mccarley E 03/06/2016, 7:25 AM

## 2016-03-07 ENCOUNTER — Encounter (HOSPITAL_COMMUNITY): Payer: Self-pay | Admitting: Obstetrics and Gynecology

## 2016-03-07 DIAGNOSIS — Z8041 Family history of malignant neoplasm of ovary: Secondary | ICD-10-CM | POA: Diagnosis not present

## 2016-03-07 DIAGNOSIS — N888 Other specified noninflammatory disorders of cervix uteri: Secondary | ICD-10-CM | POA: Diagnosis not present

## 2016-03-07 DIAGNOSIS — M47816 Spondylosis without myelopathy or radiculopathy, lumbar region: Secondary | ICD-10-CM | POA: Diagnosis not present

## 2016-03-07 DIAGNOSIS — D270 Benign neoplasm of right ovary: Secondary | ICD-10-CM | POA: Diagnosis not present

## 2016-03-07 DIAGNOSIS — N8302 Follicular cyst of left ovary: Secondary | ICD-10-CM | POA: Diagnosis not present

## 2016-03-07 DIAGNOSIS — Z4002 Encounter for prophylactic removal of ovary: Secondary | ICD-10-CM | POA: Diagnosis not present

## 2016-03-07 DIAGNOSIS — F419 Anxiety disorder, unspecified: Secondary | ICD-10-CM | POA: Diagnosis not present

## 2016-03-07 DIAGNOSIS — N838 Other noninflammatory disorders of ovary, fallopian tube and broad ligament: Secondary | ICD-10-CM | POA: Diagnosis not present

## 2016-03-07 DIAGNOSIS — G2581 Restless legs syndrome: Secondary | ICD-10-CM | POA: Diagnosis not present

## 2016-03-07 LAB — CBC
HEMATOCRIT: 30.3 % — AB (ref 36.0–46.0)
HEMOGLOBIN: 10.6 g/dL — AB (ref 12.0–15.0)
MCH: 29.8 pg (ref 26.0–34.0)
MCHC: 35 g/dL (ref 30.0–36.0)
MCV: 85.1 fL (ref 78.0–100.0)
Platelets: 197 10*3/uL (ref 150–400)
RBC: 3.56 MIL/uL — ABNORMAL LOW (ref 3.87–5.11)
RDW: 12.7 % (ref 11.5–15.5)
WBC: 10.1 10*3/uL (ref 4.0–10.5)

## 2016-03-07 MED ORDER — IBUPROFEN 600 MG PO TABS: 600.0000 mg | ORAL_TABLET | Freq: Four times a day (QID) | ORAL | 0 refills | Status: DC | PRN

## 2016-03-07 MED ORDER — OXYCODONE-ACETAMINOPHEN 5-325 MG PO TABS: 1.0000 | ORAL_TABLET | Freq: Four times a day (QID) | ORAL | 0 refills | Status: DC | PRN

## 2016-03-07 MED FILL — IBUPROFEN 600 MG TABLET: 600 | 8 days supply | Qty: 30 | Fill #0

## 2016-03-07 MED FILL — OXYCODONE W/APAP 5/325 TAB: 5-325 | 4 days supply | Qty: 30 | Fill #0

## 2016-03-07 NOTE — Op Note (Signed)
Deborah Martin, Deborah Martin              ACCOUNT NO.:  [id number]  MEDICAL RECORD NO.:  [id number]  LOCATION:                                FACILITY:  [facility]  PHYSICIAN:  Daleen Bo. Gaetano Net, M.D. DATE OF BIRTH:  [birth year]  DATE OF PROCEDURE:  03/06/2016 DATE OF DISCHARGE:                              OPERATIVE REPORT   PREOPERATIVE DIAGNOSIS:  Family history of ovarian cancer.  POSTOPERATIVE DIAGNOSIS:  Family history of ovarian cancer.  PROCEDURE:  Laparoscopically-assisted vaginal hysterectomy with bilateral salpingo-oophorectomy.  SURGEON:  Daleen Bo. Gaetano Net, M.D.  ASSISTANT:  Thurston Hole, M.D.  ANESTHESIA:  General endotracheal intubation.  ESTIMATED BLOOD LOSS:  100 mL.  SPECIMENS:  Uterus, bilateral fallopian tubes, and ovaries to Pathology.  INDICATIONS AND CONSENT:  This patient is a 51 year old, G2, P2, with a strong family history of ovarian cancer.  After discussion of options, she is admitted for laparoscopically assisted vaginal hysterectomy with bilateral salpingo-oophorectomy.  The procedure risks have been discussed preoperatively.  Potential risks have been reviewed preoperatively including not limited to, infection, organ damage, bleeding requiring transfusion of blood products with HIV and hepatitis acquisition, DVT, PE, pneumonia, fistula formation, pelvic pain, abdominal pain, painful intercourse, laparotomy, and return to the operating room.  All questions have been answered.  The patient states she understands and agrees, and consent was signed on the chart.  FINDINGS:  Upper abdomen is grossly normal.  Appendix is retrocecal, but normal.  Uterus is 6 weeks in size.  There is a 1 cm fibroma on the right ovary on its distal pole.  Left ovary is normal.  Fallopian tubes are normal, and anterior posterior cul-de-sacs are normal.  PROCEDURE:  The patient was taken to the operating room.  She was identified, placed in dorsal supine position, and general  anesthesia was induced via endotracheal intubation.  She was placed in a dorsal lithotomy position.  She was prepped abdominally and vaginally.  Bladder straight catheterized.  Hulka tenaculum placed the uterus as a manipulator, and she was draped in sterile fashion.  Time-out was undertaken.  The infraumbilical and suprapubic areas were injected in the midline with approximately 7 mL 0.5% plain Marcaine.  A small infraumbilical incision was made.  Disposable Veress needle was placed on the first attempt without difficulty.  Good syringe and drop test are noted.  2 L of gas were then insufflated under low pressure with good tympany in the right upper quadrant.  Veress needle was removed, and a 10/11 Xcel bladeless disposable trocar sleeve was placed using direct visualization with the laparoscope.  Following that, the operative laparoscope was used.  A small suprapubic incision was made in the midline, and a 5-mm disposable trocar sleeve was placed under direct visualization without difficulty.  The above findings were noted.  After identifying the course of the ureters bilaterally, the EnSeal bipolar cautery cutting instrument was used to take down the right infundibulopelvic ligament coming across the meso below the ovary across the round ligament down to the level of the vesicouterine peritoneum. Similar procedure was carried out on the left.  Vesicouterine peritoneum was taken down cephalad laterally.  Good hemostasis was noted. Instruments were removed.  Suprapubic  trocar sleeve was removed, and attention was turned to the vagina.  Posterior cul-de-sac was entered sharply.  Cervix was circumscribed with unipolar cautery.  Mucosa was advanced sharply and bluntly.  Using the handheld LigaSure bipolar cautery, uterosacral ligaments were taken down bilaterally followed by the bladder pillars, cardinal ligaments, and uterine vessels.  Anterior cul-de-sac was entered without difficulty.   The fundus was delivered posteriorly, and the remaining pedicles were taken down without difficulty.  All sutures will be 0 Monocryl unless otherwise designated. Uterosacral ligaments were plicated with the cuff bilaterally, then plicated in the midline with a 3rd stage.  The cuff was closed with figure-of-eights.  Foley catheter was placed, and clear urine was noted. Returning to the abdomen, pneumoperitoneum was reintroduced.  Suprapubic trocar sleeve was reintroduced under direct visualization, and lavage was carried out.  Minor bleeding at peritoneal edges was controlled with bipolar cautery.  The remaining approximately 25 mL of 0.5% plain Marcaine was instilled into the peritoneal cavity.  Arista was placed over the cuff as well.  Suprapubic trocar sleeve was removed. Pneumoperitoneum was reduced.  The umbilical trocar sleeve was removed. The umbilical incision was closed with 0 Vicryl, and subcutaneous layer under good visualization.  Skin on both was closed with interrupted Vicryl 4-0 sutures.  Dermabond was placed on both as well.  All counts were correct.  The patient was awakened, taken to the recovery room in stable condition.     Daleen Bo Gaetano Net, M.D.     JET/MEDQ  D:  03/06/2016  T:  03/07/2016  Job:  [id number]

## 2016-03-07 NOTE — Progress Notes (Signed)
Discharge to home ambulated well

## 2016-03-07 NOTE — Discharge Summary (Signed)
Physician Discharge Summary  Patient ID: Deborah Martin MRN: [id number] DOB/AGE: October 03, 1965 51 y.o.  Admit date: 03/06/2016 Discharge date: 03/07/2016  Admission Diagnoses:family history of ovarian cancer  Discharge Diagnoses:  Active Problems:   Family history of ovarian cancer   Discharged Condition: good  Hospital Course: after surgery had good resumption of bowel and bladder function. Post void U/S showed 25cc. Good pain relief and tolerating regular diet with passage of flatus. Ambulating well.  Consults: None  Significant Diagnostic Studies: labs:  Results for orders placed or performed during the hospital encounter of 03/06/16 (from the past 24 hour(s))  CBC     Status: Abnormal   Collection Time: 03/07/16  5:38 AM  Result Value Ref Range   WBC 10.1 4.0 - 10.5 K/uL   RBC 3.56 (L) 3.87 - 5.11 MIL/uL   Hemoglobin 10.6 (L) 12.0 - 15.0 g/dL   HCT 30.3 (L) 36.0 - 46.0 %   MCV 85.1 78.0 - 100.0 fL   MCH 29.8 26.0 - 34.0 pg   MCHC 35.0 30.0 - 36.0 g/dL   RDW 12.7 11.5 - 15.5 %   Platelets 197 150 - 400 K/uL    Treatments: surgery: LAVH/BSO  Discharge Exam: Blood pressure 103/64, pulse (!) 56, temperature 98.4 F (36.9 C), temperature source Oral, resp. rate 16, last menstrual period 01/09/2016, SpO2 97 %. General appearance: alert, cooperative and no distress GI: soft, non-tender; bowel sounds normal; no masses,  no organomegaly  Disposition:   Discharge Instructions    Discharge patient    Complete by:  As directed    Discharge disposition:  01-Home or Self Care   Discharge patient date:  03/07/2016     Allergies as of 03/07/2016   No Known Allergies     Medication List    STOP taking these medications   multivitamin with minerals Tabs tablet     TAKE these medications   ALPRAZolam 0.5 MG tablet Commonly known as:  XANAX Take 0.25 mg by mouth at bedtime as needed for sleep.   cetirizine 10 MG tablet Commonly known as:  ZYRTEC Take 10 mg by mouth  daily as needed for allergies.   ibuprofen 600 MG tablet Commonly known as:  ADVIL,MOTRIN Take 1 tablet (600 mg total) by mouth every 6 (six) hours as needed (mild pain).   oxyCODONE-acetaminophen 5-325 MG tablet Commonly known as:  PERCOCET/ROXICET Take 1-2 tablets by mouth every 6 (six) hours as needed for moderate pain (moderate to severe pain (when tolerating fluids)).        Signed: Lindsay Soulliere II,Kiowa Hollar E 03/07/2016, 8:28 AM

## 2016-03-07 NOTE — Discharge Instructions (Signed)
No operation automobiles No heavy lifting No vaginal entry Rest

## 2016-03-07 NOTE — Progress Notes (Signed)
POD #1 Tolerating regular diet, voiding, + flatus, ambulating, good pain control  Vitals:   03/07/16 0421 03/07/16 0806  BP: (!) 91/47 103/64  Pulse: (!) 53 (!) 56  Resp: 16 16  Temp: 98.2 F (36.8 C) 98.4 F (36.9 C)    Abdomen soft  Results for orders placed or performed during the hospital encounter of 03/06/16 (from the past 24 hour(s))  CBC     Status: Abnormal   Collection Time: 03/07/16  5:38 AM  Result Value Ref Range   WBC 10.1 4.0 - 10.5 K/uL   RBC 3.56 (L) 3.87 - 5.11 MIL/uL   Hemoglobin 10.6 (L) 12.0 - 15.0 g/dL   HCT 30.3 (L) 36.0 - 46.0 %   MCV 85.1 78.0 - 100.0 fL   MCH 29.8 26.0 - 34.0 pg   MCHC 35.0 30.0 - 36.0 g/dL   RDW 12.7 11.5 - 15.5 %   Platelets 197 150 - 400 K/uL   A/P : Satisfactory recovery          D/C home          Instructions given

## 2016-03-20 MED FILL — ESTRADIOL 0.1 MG/DAY PATCH: 0.1 | 28 days supply | Qty: 4 | Fill #0

## 2016-03-24 MED FILL — ZOLPIDEM TARTRATE 5 MG TAB: 5 | 30 days supply | Qty: 30 | Fill #0

## 2016-04-10 MED FILL — ZOLPIDEM TART ER 6.25 MG TA: 6.25 | 30 days supply | Qty: 30 | Fill #0

## 2016-04-10 MED FILL — ESTRADIOL 1 MG TABLET: 1 | 30 days supply | Qty: 30 | Fill #0

## 2016-05-12 MED FILL — ZOLPIDEM TART ER 6.25 MG TA: 6.25 | 30 days supply | Qty: 30 | Fill #1

## 2016-05-12 MED FILL — ESTRADIOL 1 MG TABLET: 1 | 30 days supply | Qty: 30 | Fill #1

## 2016-06-05 MED FILL — ESTRADIOL 1 MG TABLET: 1 | 30 days supply | Qty: 30 | Fill #2

## 2016-06-09 MED FILL — ZOLPIDEM TART ER 6.25 MG TA: 6.25 | 30 days supply | Qty: 30 | Fill #2

## 2016-07-10 MED FILL — ESTRADIOL 1 MG TABLET: 1 | 30 days supply | Qty: 30 | Fill #3

## 2016-07-11 MED FILL — ZOLPIDEM TART ER 6.25 MG TA: 6.25 | 30 days supply | Qty: 30 | Fill #0

## 2016-07-25 NOTE — Addendum Note (Signed)
Addendum  created 07/25/16 0843 by Effie Berkshire, MD   Sign clinical note

## 2016-07-31 DIAGNOSIS — M5126 Other intervertebral disc displacement, lumbar region: Secondary | ICD-10-CM | POA: Diagnosis not present

## 2016-07-31 DIAGNOSIS — M5116 Intervertebral disc disorders with radiculopathy, lumbar region: Secondary | ICD-10-CM | POA: Diagnosis not present

## 2016-07-31 MED FILL — predniSONE 5 MG TABS: 5 | 6 days supply | Qty: 21 | Fill #0

## 2016-07-31 MED FILL — METHOCARBAMOL 500 MG TABLET: 500 | 10 days supply | Qty: 30 | Fill #0

## 2016-07-31 MED FILL — HYDROCODON-APAP 7.5-325: 7.5-325 | 10 days supply | Qty: 20 | Fill #0

## 2016-08-11 MED FILL — ESTRADIOL 1 MG TABLET: 1 | 30 days supply | Qty: 30 | Fill #4

## 2016-08-25 MED FILL — ZOLPIDEM TART ER 6.25 MG TA: 6.25 | 30 days supply | Qty: 30 | Fill #1

## 2016-08-26 DIAGNOSIS — R040 Epistaxis: Secondary | ICD-10-CM | POA: Diagnosis not present

## 2016-08-26 DIAGNOSIS — H9312 Tinnitus, left ear: Secondary | ICD-10-CM | POA: Diagnosis not present

## 2016-08-26 DIAGNOSIS — R002 Palpitations: Secondary | ICD-10-CM | POA: Diagnosis not present

## 2016-09-04 DIAGNOSIS — M549 Dorsalgia, unspecified: Secondary | ICD-10-CM | POA: Diagnosis not present

## 2016-09-04 DIAGNOSIS — R002 Palpitations: Secondary | ICD-10-CM | POA: Diagnosis not present

## 2016-09-04 DIAGNOSIS — F419 Anxiety disorder, unspecified: Secondary | ICD-10-CM | POA: Diagnosis not present

## 2016-09-04 DIAGNOSIS — H9312 Tinnitus, left ear: Secondary | ICD-10-CM | POA: Diagnosis not present

## 2016-09-04 DIAGNOSIS — Z0000 Encounter for general adult medical examination without abnormal findings: Secondary | ICD-10-CM | POA: Diagnosis not present

## 2016-09-04 MED FILL — ESCITALOPRAM 10 MG TABLET: 10 | 30 days supply | Qty: 30 | Fill #0

## 2016-09-09 MED FILL — ESTRADIOL 1 MG TABLET: 1 | 30 days supply | Qty: 30 | Fill #5

## 2016-09-29 MED FILL — ZOLPIDEM TART ER 6.25 MG TA: 6.25 | 30 days supply | Qty: 30 | Fill #2

## 2016-10-08 MED FILL — ESTRADIOL 1 MG TABLET: 1 | 30 days supply | Qty: 30 | Fill #6

## 2016-10-08 MED FILL — ESCITALOPRAM 10 MG TABLET: 10 | 30 days supply | Qty: 30 | Fill #1

## 2016-10-14 DIAGNOSIS — H9313 Tinnitus, bilateral: Secondary | ICD-10-CM | POA: Diagnosis not present

## 2016-10-14 DIAGNOSIS — H93293 Other abnormal auditory perceptions, bilateral: Secondary | ICD-10-CM | POA: Diagnosis not present

## 2016-10-17 DIAGNOSIS — F419 Anxiety disorder, unspecified: Secondary | ICD-10-CM | POA: Diagnosis not present

## 2016-10-30 MED FILL — ZOLPIDEM TART ER 6.25 MG TA: 6.25 | 30 days supply | Qty: 30 | Fill #3

## 2016-11-05 MED FILL — ESTRADIOL 1 MG TABLET: 1 | 30 days supply | Qty: 30 | Fill #7

## 2016-11-05 MED FILL — ESCITALOPRAM 10 MG TABLET: 10 | 30 days supply | Qty: 30 | Fill #0

## 2016-12-05 MED FILL — ZOLPIDEM TART ER 6.25 MG TA: 6.25 | 30 days supply | Qty: 30 | Fill #0

## 2016-12-10 MED FILL — ESTRADIOL 1 MG TABLET: 1 | 30 days supply | Qty: 30 | Fill #8

## 2016-12-10 MED FILL — ESCITALOPRAM 10 MG TABLET: 10 | 30 days supply | Qty: 30 | Fill #1

## 2017-01-12 MED FILL — ESTRADIOL 1 MG TABLET: 1 | 30 days supply | Qty: 30 | Fill #9

## 2017-01-14 DIAGNOSIS — D225 Melanocytic nevi of trunk: Secondary | ICD-10-CM | POA: Diagnosis not present

## 2017-01-14 DIAGNOSIS — L812 Freckles: Secondary | ICD-10-CM | POA: Diagnosis not present

## 2017-01-14 DIAGNOSIS — L814 Other melanin hyperpigmentation: Secondary | ICD-10-CM | POA: Diagnosis not present

## 2017-01-14 DIAGNOSIS — D2262 Melanocytic nevi of left upper limb, including shoulder: Secondary | ICD-10-CM | POA: Diagnosis not present

## 2017-01-14 DIAGNOSIS — D1801 Hemangioma of skin and subcutaneous tissue: Secondary | ICD-10-CM | POA: Diagnosis not present

## 2017-01-14 DIAGNOSIS — D2271 Melanocytic nevi of right lower limb, including hip: Secondary | ICD-10-CM | POA: Diagnosis not present

## 2017-01-14 DIAGNOSIS — D2272 Melanocytic nevi of left lower limb, including hip: Secondary | ICD-10-CM | POA: Diagnosis not present

## 2017-01-14 MED FILL — ZOLPIDEM TART ER 6.25 MG TA: 6.25 | 30 days supply | Qty: 30 | Fill #0

## 2017-01-20 MED FILL — ESCITALOPRAM 10 MG TABLET: 10 | 30 days supply | Qty: 30 | Fill #2

## 2017-01-22 DIAGNOSIS — Z01419 Encounter for gynecological examination (general) (routine) without abnormal findings: Secondary | ICD-10-CM | POA: Diagnosis not present

## 2017-01-22 DIAGNOSIS — Z801 Family history of malignant neoplasm of trachea, bronchus and lung: Secondary | ICD-10-CM | POA: Diagnosis not present

## 2017-01-22 DIAGNOSIS — Z803 Family history of malignant neoplasm of breast: Secondary | ICD-10-CM | POA: Diagnosis not present

## 2017-01-22 DIAGNOSIS — Z808 Family history of malignant neoplasm of other organs or systems: Secondary | ICD-10-CM | POA: Diagnosis not present

## 2017-01-22 DIAGNOSIS — Z800 Family history of malignant neoplasm of digestive organs: Secondary | ICD-10-CM | POA: Diagnosis not present

## 2017-01-22 DIAGNOSIS — Z8041 Family history of malignant neoplasm of ovary: Secondary | ICD-10-CM | POA: Diagnosis not present

## 2017-01-22 DIAGNOSIS — Z1231 Encounter for screening mammogram for malignant neoplasm of breast: Secondary | ICD-10-CM | POA: Diagnosis not present

## 2017-01-22 DIAGNOSIS — Z6832 Body mass index (BMI) 32.0-32.9, adult: Secondary | ICD-10-CM | POA: Diagnosis not present

## 2017-02-11 MED FILL — ESTRADIOL 1 MG TABLET: 1 | 30 days supply | Qty: 30 | Fill #10

## 2017-02-13 MED FILL — PHENTERMINE 37.5 MG TABLET: 37.5 | 30 days supply | Qty: 30 | Fill #0

## 2017-02-13 MED FILL — ESCITALOPRAM 10 MG TABLET: 10 | 30 days supply | Qty: 30 | Fill #3

## 2017-02-13 MED FILL — ZOLPIDEM TART ER 6.25 MG TA: 6.25 | 30 days supply | Qty: 30 | Fill #1

## 2017-03-06 DIAGNOSIS — M8588 Other specified disorders of bone density and structure, other site: Secondary | ICD-10-CM | POA: Diagnosis not present

## 2017-03-06 DIAGNOSIS — N958 Other specified menopausal and perimenopausal disorders: Secondary | ICD-10-CM | POA: Diagnosis not present

## 2017-03-06 DIAGNOSIS — Z809 Family history of malignant neoplasm, unspecified: Secondary | ICD-10-CM | POA: Diagnosis not present

## 2017-03-16 MED FILL — ESTRADIOL 1 MG TABLET: 1 | 30 days supply | Qty: 30 | Fill #11

## 2017-03-20 MED FILL — PHENTERMINE 37.5 MG TABLET: 37.5 | 30 days supply | Qty: 30 | Fill #0

## 2017-04-06 MED FILL — ESCITALOPRAM 10 MG TABLET: 10 | 30 days supply | Qty: 30 | Fill #4

## 2017-04-10 DIAGNOSIS — H5213 Myopia, bilateral: Secondary | ICD-10-CM | POA: Diagnosis not present

## 2017-04-14 MED FILL — ESTRADIOL 1 MG TABLET: 1 | 30 days supply | Qty: 30 | Fill #0

## 2017-04-20 MED FILL — ZOLPIDEM TART ER 6.25 MG TA: 6.25 | 30 days supply | Qty: 30 | Fill #2

## 2017-04-23 MED FILL — NYSTATIN 100,000 UNIT/GM CR: 100000 | 20 days supply | Qty: 60 | Fill #0

## 2017-04-23 MED FILL — PHENTERMINE 37.5 MG TABLET: 37.5 | 30 days supply | Qty: 30 | Fill #0

## 2017-05-15 MED FILL — ESTRADIOL 1 MG TABLET: 1 | 30 days supply | Qty: 30 | Fill #1

## 2017-05-15 MED FILL — ESCITALOPRAM 10 MG TABLET: 10 | 30 days supply | Qty: 30 | Fill #5

## 2017-05-18 MED FILL — ZOLPIDEM TART ER 6.25 MG TA: 6.25 | 30 days supply | Qty: 30 | Fill #0

## 2017-06-10 DIAGNOSIS — R05 Cough: Secondary | ICD-10-CM | POA: Diagnosis not present

## 2017-06-10 DIAGNOSIS — J22 Unspecified acute lower respiratory infection: Secondary | ICD-10-CM | POA: Diagnosis not present

## 2017-06-10 MED FILL — AZITHROMYCIN 250 MG TABLET: 250 | 5 days supply | Qty: 6 | Fill #0

## 2017-06-10 MED FILL — CHERATUSSIN AC SYRUP: 100-10 | 5 days supply | Qty: 50 | Fill #0

## 2017-06-15 MED FILL — ZOLPIDEM TART ER 6.25 MG TA: 6.25 | 30 days supply | Qty: 30 | Fill #1

## 2017-06-15 MED FILL — ESTRADIOL 1 MG TABLET: 1 | 30 days supply | Qty: 30 | Fill #2

## 2017-06-15 MED FILL — ESCITALOPRAM 10 MG TABLET: 10 | 30 days supply | Qty: 30 | Fill #0

## 2017-06-17 MED FILL — PHENTERMINE 37.5 MG TABLET: 37.5 | 30 days supply | Qty: 30 | Fill #0

## 2017-07-15 MED FILL — ESTRADIOL 1 MG TABLET: 1 | 30 days supply | Qty: 30 | Fill #3

## 2017-07-15 MED FILL — ESCITALOPRAM 10 MG TABLET: 10 | 30 days supply | Qty: 30 | Fill #1

## 2017-07-15 MED FILL — ZOLPIDEM TART ER 6.25 MG TA: 6.25 | 30 days supply | Qty: 30 | Fill #2

## 2017-08-13 MED FILL — ESTRADIOL 1 MG TABLET: 1 | 30 days supply | Qty: 30 | Fill #4

## 2017-08-13 MED FILL — ZOLPIDEM TART ER 6.25 MG TA: 6.25 | 30 days supply | Qty: 30 | Fill #0

## 2017-08-13 MED FILL — ESCITALOPRAM 10 MG TABLET: 10 | 30 days supply | Qty: 30 | Fill #2

## 2017-09-08 MED FILL — ESCITALOPRAM 10 MG TABLET: 10 | 30 days supply | Qty: 30 | Fill #0

## 2017-09-08 MED FILL — ESTRADIOL 1 MG TABLET: 1 | 30 days supply | Qty: 30 | Fill #5

## 2017-09-10 ENCOUNTER — Ambulatory Visit: Payer: 59 | Admitting: Nutrition

## 2017-09-10 DIAGNOSIS — G4700 Insomnia, unspecified: Secondary | ICD-10-CM | POA: Diagnosis not present

## 2017-09-10 DIAGNOSIS — K5900 Constipation, unspecified: Secondary | ICD-10-CM | POA: Diagnosis not present

## 2017-09-10 DIAGNOSIS — Z131 Encounter for screening for diabetes mellitus: Secondary | ICD-10-CM | POA: Diagnosis not present

## 2017-09-10 DIAGNOSIS — F419 Anxiety disorder, unspecified: Secondary | ICD-10-CM | POA: Diagnosis not present

## 2017-09-10 DIAGNOSIS — Z0000 Encounter for general adult medical examination without abnormal findings: Secondary | ICD-10-CM | POA: Diagnosis not present

## 2017-09-10 DIAGNOSIS — Z136 Encounter for screening for cardiovascular disorders: Secondary | ICD-10-CM | POA: Diagnosis not present

## 2017-09-11 MED FILL — ZOLPIDEM TART ER 6.25 MG TA: 6.25 | 30 days supply | Qty: 30 | Fill #1

## 2017-09-22 DIAGNOSIS — H8110 Benign paroxysmal vertigo, unspecified ear: Secondary | ICD-10-CM | POA: Diagnosis not present

## 2017-09-22 MED FILL — MECLIZINE 25 MG TABLET: 25 | 7 days supply | Qty: 20 | Fill #0

## 2017-09-24 ENCOUNTER — Encounter: Payer: 59 | Attending: Family Medicine | Admitting: Nutrition

## 2017-09-24 VITALS — Ht 63.5 in | Wt 175.0 lb

## 2017-09-24 DIAGNOSIS — E669 Obesity, unspecified: Secondary | ICD-10-CM

## 2017-09-24 NOTE — Progress Notes (Signed)
  Medical Nutrition Therapy:  Appt start time: 1000 end time:  1100.   Assessment: First Visit  Primary concerns today: Overweight BMI 30.. Lives with her daughter. She does cooking and shopping. Eat 50% meals at home. SHE notes she usually doesn't eat breakfast and eats most of food later in evening. Desire to lose 20 lbs.  She feels she stress eats a lot. Doesn't plan meals ahead. Stays busy and doesn't cook much..  Physical activity: goes to gym a few times per week not consistently.Martin Majestic to a weight loss clinic in [place]. Got a B12 and fenteramine. Lost 14 lbs. Gained some back. Had tried to cut out breads and more protein and vegetables.  Willing to work on making lifestyle changes to improve her health.  Current diet is excessive in carbs and inconsistent to best meet her needs for weight loss.   Wt Readings from Last 3 Encounters:  10/08/17 177 lb (80.3 kg)  10/01/17 173 lb 3.2 oz (78.6 kg)  09/24/17 175 lb (79.4 kg)   Ht Readings from Last 3 Encounters:  10/08/17 5\' 4"  (1.626 m)  10/01/17 5\' 4"  (1.626 m)  09/24/17 5' 3.5" (1.613 m)   Body mass index is 30.51 kg/m.    Preferred Learning Style:   No preference indicated   Learning Readiness:   Ready  Change in progress   MEDICATIONS: see list   DIETARY INTAKE:    24-hr recall:  B ( AM): skipped or boiled eggs with bacon or scr egg sandwich  Snk ( AM): none or protein bar  L ( PM): boneless wings and peach, water Snk ( PM): cheese its D ( PM): grilled chicken on pita bread, water Snk ( PM): popcorn Beverages: water  Usual physical activity: works out at gym once a week  Estimated energy needs: 1200-1500 calories 135 g carbohydrates 90 g protein 33 g fat  Progress Towards Goal(s):  In progress.   Nutritional Diagnosis:  NI-1.5 Excessive energy intake As related to Obesity.  As evidenced by BMI 30.    Intervention:  Nutrition and weight loss education provided on My Plate, CHO counting, meal  planning, portion sizes, timing of meals, avoiding snacks between meals unless  benefits of exercising  150 minutes of exercise a week and prevention of  DM.  Marland KitchenGoals 1. Follow My Plate 2. Plan and prep meals 3. Exervcise 150 minutes a week 4. Lose 1 lb per week 5. Cook more meals at home.  Teaching Method Utilized:  Visual Auditory Hands on  Handouts given during visit include:  The Plate Method   Meal Plan Card  Weight loss tips   Barriers to learning/adherence to lifestyle change:   Demonstrated degree of understanding via:  Teach Back   Monitoring/Evaluation:  Dietary intake, exercise, meal planning , and body weight in 1 week.

## 2017-09-24 NOTE — Patient Instructions (Signed)
Goals 1. Follow My Plate 2. Plan and prep meals 3. Exervcise 150 minutes a week 4. Lose 1 lb per week 5. Cook more meals at home.

## 2017-10-01 ENCOUNTER — Encounter: Payer: Self-pay | Admitting: Nutrition

## 2017-10-01 ENCOUNTER — Encounter: Payer: 59 | Attending: Family Medicine | Admitting: Nutrition

## 2017-10-01 VITALS — Ht 64.0 in | Wt 173.2 lb

## 2017-10-01 DIAGNOSIS — E663 Overweight: Secondary | ICD-10-CM

## 2017-10-01 NOTE — Patient Instructions (Addendum)
Goals 1. Will  Exercise 4 times per week. 2. Continue meal planning 3. Drink more water Lose 1 lb per week. Eat 30 grams of carbs per meal.

## 2017-10-01 NOTE — Progress Notes (Signed)
  Medical Nutrition Therapy:  Appt start time: 0930 end time:  1000.   Assessment:  Primary concerns today:  Second VIsit Overweight. Lives with her daughter.  Has been planning meals, measuring foods out. Lost 2 lbs. Using sectional plates.Marland Kitchen Has walked at lunch at times. Is having some vertigo. Meals are fairly well balanced. May benefti from 30 grams of carbs per meal.   Preferred Learning Style:   No preference indicated   Learning Readiness:   Ready  Change in progress   MEDICATIONS: see list   DIETARY INTAKE:    24-hr recall:  B ( AM): skipped or boiled eggs with bacon or scr egg sandwich  Snk ( AM): none or protein bar  L ( PM): boneless wings and peach, water Snk ( PM): cheese its D ( PM): grilled chicken on pita bread, water Snk ( PM): popcorn Beverages: water  Usual physical activity: works out at gym once a week  Estimated energy needs: 1200  calories 135 g carbohydrates 90 g protein 33 g fat  Progress Towards Goal(s):  In progress.   Nutritional Diagnosis:  NB-1.1 Food and nutrition-related knowledge deficit As related to overweight.  As evidenced by BMI 30.    Intervention:  Nutrition Nutrition and weight loss  education provided on My Plate, CHO counting, meal planning, portion sizes, timing of meals, avoiding snacks between meals benefits of exercising 30 minutes per day and prevention of  DM reading food labes. . Goals 1. Will  Exercise 4 times per week. 2. Continue meal planning 3. Drink more water Lose 1 lb per week. Eat 30 grams of carbs per meal.  Teaching Method Utilized:  Visual Auditory Hands on  Handouts given during visit include:  Summertime eating out  Dining out    Barriers to learning/adherence to lifestyle change: none  Demonstrated degree of understanding via:  Teach Back   Monitoring/Evaluation:  Dietary intake, exercise, meal planning, and body weight in 1 week(s).

## 2017-10-08 ENCOUNTER — Encounter: Payer: Self-pay | Admitting: Nutrition

## 2017-10-08 ENCOUNTER — Encounter: Payer: 59 | Attending: Family Medicine | Admitting: Nutrition

## 2017-10-08 VITALS — Ht 64.0 in | Wt 177.0 lb

## 2017-10-08 DIAGNOSIS — E669 Obesity, unspecified: Secondary | ICD-10-CM

## 2017-10-08 NOTE — Progress Notes (Signed)
  Medical Nutrition Therapy:  Appt start time: 0930 end time:  1000.   Assessment:  Primary concerns today:  Third  VIsit Overweight. Lives with her daughter.  Has been eating cleaner foods.   Her vertigo has improved drastically since eating better balance meals. NO epidodes in 2 weeks since trying to eat bettter.  Feels better. Getting ready for her daughter to go to college.  WIlling to make more time for planning meals and prepping food for healthier options.  Hasn't started exercising yet. Will after next week. Gaine 4 lbs. Feels constipated. Hasn't been drinking as much water. Trying to eat 30 grams of carbs per meals. Wants to lose 10 lbs.   Preferred Learning Style:   No preference indicated   Learning Readiness:   Ready  Change in progress   MEDICATIONS: see list   DIETARY INTAKE:    24-hr recall:  B ( AM) Egg sandwich or egg white delight Snk ( AM): L ( PM): pakcing lunch, chicken, brussell sprouts and peach, water Snk ( PM): D ( PM): grilled chicken on pita bread, water Snk ( PM):  Beverages: water  Usual physical activity: works out at gym once a week  Estimated energy needs: 1200  calories 135 g carbohydrates 90 g protein 33 g fat  Progress Towards Goal(s):  In progress.   Nutritional Diagnosis:  NB-1.1 Food and nutrition-related knowledge deficit As related to overweight.  As evidenced by BMI 30.    Intervention:  Nutrition Nutrition and weight loss  education provided on My Plate, CHO counting, meal planning, portion sizes, timing of meals, avoiding snacks between meals benefits of exercising 30 minutes per day and prevention of  DM reading food labes. . Goals  Increase water intake  To 64 oz Increaser fiber rich foods.  Increase exercise as tolerated Aim to lose 2 lbs per month  Teaching Method Utilized:  Visual Auditory Hands on  Handouts given during visit include:  Summertime eating out  Dining out    Barriers to  learning/adherence to lifestyle change: none  Demonstrated degree of understanding via:  Teach Back   Monitoring/Evaluation:  Dietary intake, exercise, meal planning, and body weight in PRN

## 2017-10-08 NOTE — Patient Instructions (Signed)
Goals  Increase water intake  To 64 oz Increaser fiber rich foods.  Increase exercise as tolerated Aim to lose 2 lbs per month

## 2017-10-09 MED FILL — ESTRADIOL 1 MG TABLET: 1 | 30 days supply | Qty: 30 | Fill #6

## 2017-10-20 ENCOUNTER — Encounter: Payer: Self-pay | Admitting: Nutrition

## 2017-10-21 MED FILL — ESCITALOPRAM 10 MG TABLET: 10 | 30 days supply | Qty: 30 | Fill #0

## 2017-10-21 MED FILL — ZOLPIDEM TART ER 6.25 MG TA: 6.25 | 30 days supply | Qty: 30 | Fill #2

## 2017-11-05 MED FILL — ESTRADIOL 1 MG TABLET: 1 | 30 days supply | Qty: 30 | Fill #7

## 2017-11-20 DIAGNOSIS — R194 Change in bowel habit: Secondary | ICD-10-CM | POA: Diagnosis not present

## 2017-11-20 DIAGNOSIS — R140 Abdominal distension (gaseous): Secondary | ICD-10-CM | POA: Diagnosis not present

## 2017-11-24 MED FILL — ESCITALOPRAM 10 MG TABLET: 10 | 30 days supply | Qty: 30 | Fill #1

## 2017-11-26 MED FILL — ZOLPIDEM TART ER 6.25 MG TA: 6.25 | 30 days supply | Qty: 30 | Fill #0

## 2017-12-15 MED FILL — ESTRADIOL 1 MG TABLET: 1 | 30 days supply | Qty: 30 | Fill #8

## 2017-12-16 DIAGNOSIS — R140 Abdominal distension (gaseous): Secondary | ICD-10-CM | POA: Diagnosis not present

## 2017-12-16 DIAGNOSIS — R197 Diarrhea, unspecified: Secondary | ICD-10-CM | POA: Diagnosis not present

## 2017-12-17 DIAGNOSIS — R197 Diarrhea, unspecified: Secondary | ICD-10-CM | POA: Diagnosis not present

## 2017-12-28 MED FILL — ESCITALOPRAM 10 MG TABLET: 10 | 30 days supply | Qty: 30 | Fill #2

## 2017-12-28 MED FILL — ZOLPIDEM TART ER 6.25 MG TA: 6.25 | 30 days supply | Qty: 30 | Fill #1

## 2018-01-13 MED FILL — PHENTERMINE 37.5 MG TABLET: 37.5 | 30 days supply | Qty: 30 | Fill #0

## 2018-01-13 MED FILL — ESTRADIOL 1 MG TABLET: 1 | 30 days supply | Qty: 30 | Fill #9

## 2018-01-29 MED FILL — ZOLPIDEM TART ER 6.25 MG TA: 6.25 | 30 days supply | Qty: 30 | Fill #0

## 2018-02-02 MED FILL — ESCITALOPRAM 10 MG TABLET: 10 | 30 days supply | Qty: 30 | Fill #3

## 2018-02-16 MED FILL — ESTRADIOL 1 MG TABLET: 1 | 30 days supply | Qty: 30 | Fill #0

## 2018-02-26 DIAGNOSIS — D1801 Hemangioma of skin and subcutaneous tissue: Secondary | ICD-10-CM | POA: Diagnosis not present

## 2018-02-26 DIAGNOSIS — D2262 Melanocytic nevi of left upper limb, including shoulder: Secondary | ICD-10-CM | POA: Diagnosis not present

## 2018-02-26 DIAGNOSIS — D225 Melanocytic nevi of trunk: Secondary | ICD-10-CM | POA: Diagnosis not present

## 2018-02-26 DIAGNOSIS — D2261 Melanocytic nevi of right upper limb, including shoulder: Secondary | ICD-10-CM | POA: Diagnosis not present

## 2018-02-26 DIAGNOSIS — L814 Other melanin hyperpigmentation: Secondary | ICD-10-CM | POA: Diagnosis not present

## 2018-02-26 DIAGNOSIS — D224 Melanocytic nevi of scalp and neck: Secondary | ICD-10-CM | POA: Diagnosis not present

## 2018-03-03 DIAGNOSIS — Z1231 Encounter for screening mammogram for malignant neoplasm of breast: Secondary | ICD-10-CM | POA: Diagnosis not present

## 2018-03-03 DIAGNOSIS — Z6833 Body mass index (BMI) 33.0-33.9, adult: Secondary | ICD-10-CM | POA: Diagnosis not present

## 2018-03-03 DIAGNOSIS — Z01419 Encounter for gynecological examination (general) (routine) without abnormal findings: Secondary | ICD-10-CM | POA: Diagnosis not present

## 2018-03-22 MED FILL — ESCITALOPRAM 10 MG TABLET: 10 | 30 days supply | Qty: 30 | Fill #4

## 2018-03-22 MED FILL — ESTRADIOL 1 MG TABLET: 1 | 30 days supply | Qty: 30 | Fill #0

## 2018-04-09 DIAGNOSIS — R5383 Other fatigue: Secondary | ICD-10-CM | POA: Diagnosis not present

## 2018-04-09 DIAGNOSIS — R609 Edema, unspecified: Secondary | ICD-10-CM | POA: Diagnosis not present

## 2018-04-09 DIAGNOSIS — R208 Other disturbances of skin sensation: Secondary | ICD-10-CM | POA: Diagnosis not present

## 2018-04-09 DIAGNOSIS — R635 Abnormal weight gain: Secondary | ICD-10-CM | POA: Diagnosis not present

## 2018-04-09 MED FILL — HYDROCHLOROTHIAZIDE 12.5 MG: 12.5 | 30 days supply | Qty: 30 | Fill #0

## 2018-04-16 DIAGNOSIS — F419 Anxiety disorder, unspecified: Secondary | ICD-10-CM | POA: Diagnosis not present

## 2018-04-16 DIAGNOSIS — R635 Abnormal weight gain: Secondary | ICD-10-CM | POA: Diagnosis not present

## 2018-04-16 DIAGNOSIS — R252 Cramp and spasm: Secondary | ICD-10-CM | POA: Diagnosis not present

## 2018-04-16 DIAGNOSIS — R609 Edema, unspecified: Secondary | ICD-10-CM | POA: Diagnosis not present

## 2018-04-26 MED FILL — ESTRADIOL 1 MG TABLET: 1 | 30 days supply | Qty: 30 | Fill #1 | Status: TO

## 2018-04-27 DIAGNOSIS — R7309 Other abnormal glucose: Secondary | ICD-10-CM | POA: Diagnosis not present

## 2018-05-21 MED FILL — ESTRADIOL 1 MG TABS: 1 | 90 days supply | Qty: 90 | Fill #0

## 2018-07-12 MED FILL — ESCITALOPRAM 10 MG TABLET: 10 | 30 days supply | Qty: 30 | Fill #0

## 2018-08-23 DIAGNOSIS — R21 Rash and other nonspecific skin eruption: Secondary | ICD-10-CM | POA: Diagnosis not present

## 2018-08-23 MED FILL — predniSONE 20 MG TABS: 20 | 10 days supply | Qty: 15 | Fill #0

## 2018-08-23 MED FILL — PERMETHRIN 5 % CREA: 5 | 1 days supply | Qty: 60 | Fill #0

## 2018-09-03 MED FILL — ESTRADIOL 1 MG TABS: 1 | 90 days supply | Qty: 90 | Fill #1

## 2018-09-17 DIAGNOSIS — R635 Abnormal weight gain: Secondary | ICD-10-CM | POA: Diagnosis not present

## 2018-09-17 DIAGNOSIS — Z0000 Encounter for general adult medical examination without abnormal findings: Secondary | ICD-10-CM | POA: Diagnosis not present

## 2018-09-17 DIAGNOSIS — R609 Edema, unspecified: Secondary | ICD-10-CM | POA: Diagnosis not present

## 2018-10-12 MED FILL — ESCITALOPRAM 10 MG TABLET: 10 | 30 days supply | Qty: 30 | Fill #1

## 2018-10-25 MED FILL — ZOLPIDEM TART ER 6.25 MG TA: 6.25 | 30 days supply | Qty: 30 | Fill #0

## 2018-11-03 DIAGNOSIS — N907 Vulvar cyst: Secondary | ICD-10-CM | POA: Diagnosis not present

## 2018-11-03 MED FILL — CEPHALEXIN 500 MG CAPSULE: 500 | 5 days supply | Qty: 20 | Fill #0

## 2018-11-05 MED FILL — ZOLPIDEM TART ER 6.25 MG TA: 6.25 | 30 days supply | Qty: 30 | Fill #0

## 2018-11-26 MED FILL — ESTRADIOL 1 MG TABS: 1 | 90 days supply | Qty: 90 | Fill #2

## 2018-12-02 MED FILL — ZOLPIDEM TART ER 6.25 MG TA: 6.25 | 30 days supply | Qty: 30 | Fill #1

## 2019-01-05 MED FILL — ZOLPIDEM TART ER 6.25 MG TA: 6.25 | 30 days supply | Qty: 30 | Fill #2

## 2019-01-06 DIAGNOSIS — L821 Other seborrheic keratosis: Secondary | ICD-10-CM | POA: Diagnosis not present

## 2019-02-08 MED FILL — ZOLPIDEM TART ER 6.25 MG TA: 6.25 | 30 days supply | Qty: 30 | Fill #0

## 2019-02-13 DIAGNOSIS — U071 COVID-19: Secondary | ICD-10-CM | POA: Diagnosis not present

## 2019-02-23 DIAGNOSIS — H5213 Myopia, bilateral: Secondary | ICD-10-CM | POA: Diagnosis not present

## 2019-03-07 MED FILL — ESCITALOPRAM 10 MG TABLET: 10 | 90 days supply | Qty: 90 | Fill #0

## 2019-03-08 MED FILL — ESTRADIOL 1 MG TABLET: 1 | 30 days supply | Qty: 30 | Fill #0

## 2019-03-10 MED FILL — ZOLPIDEM TART ER 6.25 MG TA: 6.25 | 30 days supply | Qty: 30 | Fill #0

## 2019-04-07 MED FILL — ESTRADIOL 1 MG TABLET: 1 | 30 days supply | Qty: 30 | Fill #1

## 2019-04-08 MED FILL — ZOLPIDEM TART ER 6.25 MG TA: 6.25 | 30 days supply | Qty: 30 | Fill #0

## 2019-04-13 DIAGNOSIS — R635 Abnormal weight gain: Secondary | ICD-10-CM | POA: Diagnosis not present

## 2019-04-13 DIAGNOSIS — E782 Mixed hyperlipidemia: Secondary | ICD-10-CM | POA: Diagnosis not present

## 2019-04-13 DIAGNOSIS — N951 Menopausal and female climacteric states: Secondary | ICD-10-CM | POA: Diagnosis not present

## 2019-04-19 DIAGNOSIS — N959 Unspecified menopausal and perimenopausal disorder: Secondary | ICD-10-CM | POA: Diagnosis not present

## 2019-04-19 DIAGNOSIS — G8929 Other chronic pain: Secondary | ICD-10-CM | POA: Insufficient documentation

## 2019-04-19 DIAGNOSIS — Z01419 Encounter for gynecological examination (general) (routine) without abnormal findings: Secondary | ICD-10-CM | POA: Diagnosis not present

## 2019-04-19 DIAGNOSIS — R87619 Unspecified abnormal cytological findings in specimens from cervix uteri: Secondary | ICD-10-CM | POA: Insufficient documentation

## 2019-04-19 DIAGNOSIS — N952 Postmenopausal atrophic vaginitis: Secondary | ICD-10-CM | POA: Diagnosis not present

## 2019-04-19 DIAGNOSIS — M1990 Unspecified osteoarthritis, unspecified site: Secondary | ICD-10-CM | POA: Insufficient documentation

## 2019-04-19 DIAGNOSIS — F5104 Psychophysiologic insomnia: Secondary | ICD-10-CM | POA: Diagnosis not present

## 2019-04-19 DIAGNOSIS — Z13820 Encounter for screening for osteoporosis: Secondary | ICD-10-CM | POA: Diagnosis not present

## 2019-04-19 DIAGNOSIS — Z6834 Body mass index (BMI) 34.0-34.9, adult: Secondary | ICD-10-CM | POA: Diagnosis not present

## 2019-04-22 DIAGNOSIS — R6882 Decreased libido: Secondary | ICD-10-CM | POA: Diagnosis not present

## 2019-04-22 DIAGNOSIS — Z1331 Encounter for screening for depression: Secondary | ICD-10-CM | POA: Diagnosis not present

## 2019-04-22 DIAGNOSIS — R4586 Emotional lability: Secondary | ICD-10-CM | POA: Diagnosis not present

## 2019-04-22 DIAGNOSIS — Z1339 Encounter for screening examination for other mental health and behavioral disorders: Secondary | ICD-10-CM | POA: Diagnosis not present

## 2019-04-22 DIAGNOSIS — N951 Menopausal and female climacteric states: Secondary | ICD-10-CM | POA: Diagnosis not present

## 2019-04-22 DIAGNOSIS — E782 Mixed hyperlipidemia: Secondary | ICD-10-CM | POA: Diagnosis not present

## 2019-04-22 DIAGNOSIS — R232 Flushing: Secondary | ICD-10-CM | POA: Diagnosis not present

## 2019-04-22 DIAGNOSIS — Z6834 Body mass index (BMI) 34.0-34.9, adult: Secondary | ICD-10-CM | POA: Diagnosis not present

## 2019-04-22 DIAGNOSIS — G479 Sleep disorder, unspecified: Secondary | ICD-10-CM | POA: Diagnosis not present

## 2019-04-22 DIAGNOSIS — N898 Other specified noninflammatory disorders of vagina: Secondary | ICD-10-CM | POA: Diagnosis not present

## 2019-04-22 DIAGNOSIS — M2559 Pain in other specified joint: Secondary | ICD-10-CM | POA: Diagnosis not present

## 2019-04-25 DIAGNOSIS — Z6835 Body mass index (BMI) 35.0-35.9, adult: Secondary | ICD-10-CM | POA: Diagnosis not present

## 2019-04-25 DIAGNOSIS — R6882 Decreased libido: Secondary | ICD-10-CM | POA: Diagnosis not present

## 2019-04-25 DIAGNOSIS — G479 Sleep disorder, unspecified: Secondary | ICD-10-CM | POA: Diagnosis not present

## 2019-04-25 DIAGNOSIS — E782 Mixed hyperlipidemia: Secondary | ICD-10-CM | POA: Diagnosis not present

## 2019-04-25 DIAGNOSIS — R232 Flushing: Secondary | ICD-10-CM | POA: Diagnosis not present

## 2019-04-25 DIAGNOSIS — N951 Menopausal and female climacteric states: Secondary | ICD-10-CM | POA: Diagnosis not present

## 2019-04-25 DIAGNOSIS — M2559 Pain in other specified joint: Secondary | ICD-10-CM | POA: Diagnosis not present

## 2019-04-28 DIAGNOSIS — D224 Melanocytic nevi of scalp and neck: Secondary | ICD-10-CM | POA: Diagnosis not present

## 2019-04-28 DIAGNOSIS — D1801 Hemangioma of skin and subcutaneous tissue: Secondary | ICD-10-CM | POA: Diagnosis not present

## 2019-04-28 DIAGNOSIS — D2272 Melanocytic nevi of left lower limb, including hip: Secondary | ICD-10-CM | POA: Diagnosis not present

## 2019-04-28 DIAGNOSIS — D2261 Melanocytic nevi of right upper limb, including shoulder: Secondary | ICD-10-CM | POA: Diagnosis not present

## 2019-04-28 DIAGNOSIS — D225 Melanocytic nevi of trunk: Secondary | ICD-10-CM | POA: Diagnosis not present

## 2019-04-28 DIAGNOSIS — L570 Actinic keratosis: Secondary | ICD-10-CM | POA: Diagnosis not present

## 2019-04-28 DIAGNOSIS — D2262 Melanocytic nevi of left upper limb, including shoulder: Secondary | ICD-10-CM | POA: Diagnosis not present

## 2019-05-02 DIAGNOSIS — Z6834 Body mass index (BMI) 34.0-34.9, adult: Secondary | ICD-10-CM | POA: Diagnosis not present

## 2019-05-02 DIAGNOSIS — E782 Mixed hyperlipidemia: Secondary | ICD-10-CM | POA: Diagnosis not present

## 2019-05-09 DIAGNOSIS — E782 Mixed hyperlipidemia: Secondary | ICD-10-CM | POA: Diagnosis not present

## 2019-05-09 DIAGNOSIS — Z6834 Body mass index (BMI) 34.0-34.9, adult: Secondary | ICD-10-CM | POA: Diagnosis not present

## 2019-05-10 MED FILL — ESTRADIOL 1 MG TABS: 1 | 90 days supply | Qty: 90 | Fill #0

## 2019-05-10 MED FILL — ZOLPIDEM TART ER 6.25 MG TA: 6.25 | 30 days supply | Qty: 30 | Fill #0

## 2019-05-16 DIAGNOSIS — Z6834 Body mass index (BMI) 34.0-34.9, adult: Secondary | ICD-10-CM | POA: Diagnosis not present

## 2019-05-16 DIAGNOSIS — E782 Mixed hyperlipidemia: Secondary | ICD-10-CM | POA: Diagnosis not present

## 2019-05-23 DIAGNOSIS — N951 Menopausal and female climacteric states: Secondary | ICD-10-CM | POA: Diagnosis not present

## 2019-05-23 DIAGNOSIS — Z713 Dietary counseling and surveillance: Secondary | ICD-10-CM | POA: Diagnosis not present

## 2019-05-23 DIAGNOSIS — G479 Sleep disorder, unspecified: Secondary | ICD-10-CM | POA: Diagnosis not present

## 2019-05-23 DIAGNOSIS — R232 Flushing: Secondary | ICD-10-CM | POA: Diagnosis not present

## 2019-05-23 DIAGNOSIS — E782 Mixed hyperlipidemia: Secondary | ICD-10-CM | POA: Diagnosis not present

## 2019-05-23 DIAGNOSIS — Z6834 Body mass index (BMI) 34.0-34.9, adult: Secondary | ICD-10-CM | POA: Diagnosis not present

## 2019-05-23 DIAGNOSIS — R6882 Decreased libido: Secondary | ICD-10-CM | POA: Diagnosis not present

## 2019-06-09 MED FILL — ZOLPIDEM TART ER 6.25 MG TA: 6.25 | 30 days supply | Qty: 30 | Fill #1

## 2019-06-10 MED FILL — ESCITALOPRAM 10 MG TABLET: 10 | 30 days supply | Qty: 30 | Fill #0

## 2019-06-20 DIAGNOSIS — Z1231 Encounter for screening mammogram for malignant neoplasm of breast: Secondary | ICD-10-CM | POA: Diagnosis not present

## 2019-07-13 DIAGNOSIS — E349 Endocrine disorder, unspecified: Secondary | ICD-10-CM | POA: Diagnosis not present

## 2019-07-13 DIAGNOSIS — F419 Anxiety disorder, unspecified: Secondary | ICD-10-CM | POA: Diagnosis not present

## 2019-07-13 DIAGNOSIS — G4700 Insomnia, unspecified: Secondary | ICD-10-CM | POA: Diagnosis not present

## 2019-07-13 DIAGNOSIS — R5383 Other fatigue: Secondary | ICD-10-CM | POA: Diagnosis not present

## 2019-07-13 MED FILL — ESCITALOPRAM 10 MG TABLET: 10 | 90 days supply | Qty: 90 | Fill #0

## 2019-07-28 MED FILL — ZOLPIDEM TART ER 6.25 MG TA: 6.25 | 30 days supply | Qty: 30 | Fill #2

## 2019-08-12 MED FILL — ESTRADIOL 1 MG TABS: 1 | 90 days supply | Qty: 90 | Fill #1

## 2019-09-05 MED FILL — ZOLPIDEM TART ER 6.25 MG TA: 6.25 | 30 days supply | Qty: 30 | Fill #3

## 2019-10-10 MED FILL — ESCITALOPRAM 10 MG TABLET: 10 | 90 days supply | Qty: 90 | Fill #1

## 2019-11-10 MED FILL — ESTRADIOL 1 MG TABS: 1 | 90 days supply | Qty: 90 | Fill #2

## 2019-12-06 DIAGNOSIS — D485 Neoplasm of uncertain behavior of skin: Secondary | ICD-10-CM | POA: Diagnosis not present

## 2019-12-06 DIAGNOSIS — D225 Melanocytic nevi of trunk: Secondary | ICD-10-CM | POA: Diagnosis not present

## 2020-01-24 DIAGNOSIS — H11433 Conjunctival hyperemia, bilateral: Secondary | ICD-10-CM | POA: Diagnosis not present

## 2020-01-24 DIAGNOSIS — B308 Other viral conjunctivitis: Secondary | ICD-10-CM | POA: Diagnosis not present

## 2020-01-30 ENCOUNTER — Other Ambulatory Visit: Payer: 59

## 2020-01-30 DIAGNOSIS — Z20822 Contact with and (suspected) exposure to covid-19: Secondary | ICD-10-CM | POA: Diagnosis not present

## 2020-02-01 DIAGNOSIS — D485 Neoplasm of uncertain behavior of skin: Secondary | ICD-10-CM | POA: Diagnosis not present

## 2020-02-01 DIAGNOSIS — L820 Inflamed seborrheic keratosis: Secondary | ICD-10-CM | POA: Diagnosis not present

## 2020-02-01 LAB — NOVEL CORONAVIRUS, NAA: SARS-CoV-2, NAA: NOT DETECTED

## 2020-02-01 LAB — SPECIMEN STATUS REPORT

## 2020-02-01 LAB — SARS-COV-2, NAA 2 DAY TAT

## 2020-02-06 MED FILL — ESTRADIOL 1 MG TABS: 1 | 90 days supply | Qty: 90 | Fill #3

## 2020-02-07 ENCOUNTER — Other Ambulatory Visit (HOSPITAL_COMMUNITY): Payer: Self-pay | Admitting: Obstetrics and Gynecology

## 2020-02-08 MED FILL — ZOLPIDEM TART ER 6.25 MG TA: 6.25 | 30 days supply | Qty: 30 | Fill #0

## 2020-02-10 DIAGNOSIS — D225 Melanocytic nevi of trunk: Secondary | ICD-10-CM | POA: Diagnosis not present

## 2020-02-10 DIAGNOSIS — D2272 Melanocytic nevi of left lower limb, including hip: Secondary | ICD-10-CM | POA: Diagnosis not present

## 2020-02-10 DIAGNOSIS — D1801 Hemangioma of skin and subcutaneous tissue: Secondary | ICD-10-CM | POA: Diagnosis not present

## 2020-02-10 DIAGNOSIS — D2271 Melanocytic nevi of right lower limb, including hip: Secondary | ICD-10-CM | POA: Diagnosis not present

## 2020-02-16 DIAGNOSIS — Z8639 Personal history of other endocrine, nutritional and metabolic disease: Secondary | ICD-10-CM | POA: Diagnosis not present

## 2020-02-16 DIAGNOSIS — G2581 Restless legs syndrome: Secondary | ICD-10-CM | POA: Diagnosis not present

## 2020-02-19 ENCOUNTER — Other Ambulatory Visit (HOSPITAL_COMMUNITY): Payer: Self-pay | Admitting: Family Medicine

## 2020-02-20 MED FILL — PRAMIPEXOLE 0.125 MG TABLET: 0.125 | 90 days supply | Qty: 90 | Fill #0

## 2020-03-09 MED FILL — ZOLPIDEM TART ER 6.25 MG TA: 6.25 | 30 days supply | Qty: 30 | Fill #1

## 2020-04-11 MED FILL — ZOLPIDEM TART ER 6.25 MG TA: 6.25 | 30 days supply | Qty: 30 | Fill #2

## 2020-04-19 ENCOUNTER — Other Ambulatory Visit (HOSPITAL_COMMUNITY): Payer: Self-pay | Admitting: Obstetrics and Gynecology

## 2020-04-19 DIAGNOSIS — N959 Unspecified menopausal and perimenopausal disorder: Secondary | ICD-10-CM | POA: Diagnosis not present

## 2020-04-19 DIAGNOSIS — F5104 Psychophysiologic insomnia: Secondary | ICD-10-CM | POA: Diagnosis not present

## 2020-04-19 DIAGNOSIS — N644 Mastodynia: Secondary | ICD-10-CM | POA: Diagnosis not present

## 2020-04-19 DIAGNOSIS — Z01419 Encounter for gynecological examination (general) (routine) without abnormal findings: Secondary | ICD-10-CM | POA: Diagnosis not present

## 2020-04-19 DIAGNOSIS — Z6833 Body mass index (BMI) 33.0-33.9, adult: Secondary | ICD-10-CM | POA: Diagnosis not present

## 2020-04-19 MED FILL — ESTRADIOL 1 MG TABS: 1 | 90 days supply | Qty: 90 | Fill #0

## 2020-05-23 ENCOUNTER — Other Ambulatory Visit (HOSPITAL_COMMUNITY): Payer: Self-pay | Admitting: Family Medicine

## 2020-05-23 MED FILL — PRAMIPEXOLE 0.125 MG TABLET: 0.125 | 90 days supply | Qty: 90 | Fill #0

## 2020-06-12 ENCOUNTER — Other Ambulatory Visit (HOSPITAL_COMMUNITY): Payer: Self-pay | Admitting: Obstetrics and Gynecology

## 2020-06-15 ENCOUNTER — Other Ambulatory Visit (HOSPITAL_COMMUNITY): Payer: Self-pay

## 2020-06-15 MED ORDER — ZOLPIDEM TARTRATE ER 6.25 MG PO TBCR
6.2500 mg | EXTENDED_RELEASE_TABLET | Freq: Every evening | ORAL | 3 refills | Status: DC | PRN
  Filled 2020-06-15: qty 30, 30d supply, fill #0

## 2020-06-15 MED FILL — Zolpidem Tartrate Tab ER 6.25 MG: ORAL | 30 days supply | Qty: 30 | Fill #0 | Status: AC

## 2020-06-25 DIAGNOSIS — Z1231 Encounter for screening mammogram for malignant neoplasm of breast: Secondary | ICD-10-CM | POA: Diagnosis not present

## 2020-06-25 LAB — HM MAMMOGRAPHY

## 2020-06-26 ENCOUNTER — Other Ambulatory Visit: Payer: Self-pay | Admitting: Obstetrics and Gynecology

## 2020-06-26 DIAGNOSIS — R928 Other abnormal and inconclusive findings on diagnostic imaging of breast: Secondary | ICD-10-CM

## 2020-07-02 DIAGNOSIS — R928 Other abnormal and inconclusive findings on diagnostic imaging of breast: Secondary | ICD-10-CM | POA: Diagnosis not present

## 2020-07-02 DIAGNOSIS — R922 Inconclusive mammogram: Secondary | ICD-10-CM | POA: Diagnosis not present

## 2020-07-11 ENCOUNTER — Other Ambulatory Visit: Payer: 59

## 2020-07-19 ENCOUNTER — Other Ambulatory Visit (HOSPITAL_COMMUNITY): Payer: Self-pay

## 2020-07-19 MED FILL — Zolpidem Tartrate Tab ER 6.25 MG: ORAL | 30 days supply | Qty: 30 | Fill #1 | Status: AC

## 2020-08-09 DIAGNOSIS — Z131 Encounter for screening for diabetes mellitus: Secondary | ICD-10-CM | POA: Diagnosis not present

## 2020-08-09 DIAGNOSIS — Z13228 Encounter for screening for other metabolic disorders: Secondary | ICD-10-CM | POA: Diagnosis not present

## 2020-08-13 ENCOUNTER — Other Ambulatory Visit (HOSPITAL_COMMUNITY): Payer: Self-pay

## 2020-08-13 DIAGNOSIS — Z0000 Encounter for general adult medical examination without abnormal findings: Secondary | ICD-10-CM | POA: Diagnosis not present

## 2020-08-13 DIAGNOSIS — Z23 Encounter for immunization: Secondary | ICD-10-CM | POA: Diagnosis not present

## 2020-08-13 MED ORDER — ZOLPIDEM TARTRATE ER 6.25 MG PO TBCR
6.2500 mg | EXTENDED_RELEASE_TABLET | Freq: Every day | ORAL | 5 refills | Status: DC
  Filled 2020-10-23: qty 30, 30d supply, fill #0
  Filled 2020-11-19: qty 30, 30d supply, fill #1
  Filled 2021-01-21: qty 30, 30d supply, fill #2

## 2020-08-13 MED ORDER — BUSPIRONE HCL 10 MG PO TABS
10.0000 mg | ORAL_TABLET | Freq: Three times a day (TID) | ORAL | 1 refills | Status: DC | PRN
  Filled 2020-08-13: qty 30, 10d supply, fill #0

## 2020-08-13 MED ORDER — ESCITALOPRAM OXALATE 10 MG PO TABS
10.0000 mg | ORAL_TABLET | Freq: Every day | ORAL | 1 refills | Status: DC
  Filled 2020-08-13: qty 90, 90d supply, fill #0
  Filled 2021-01-21: qty 90, 90d supply, fill #1

## 2020-08-13 MED FILL — Estradiol Tab 1 MG: ORAL | 90 days supply | Qty: 90 | Fill #0 | Status: AC

## 2020-08-14 ENCOUNTER — Other Ambulatory Visit (HOSPITAL_COMMUNITY): Payer: Self-pay

## 2020-08-20 ENCOUNTER — Other Ambulatory Visit (HOSPITAL_COMMUNITY): Payer: Self-pay

## 2020-08-20 MED ORDER — PRAMIPEXOLE DIHYDROCHLORIDE 0.125 MG PO TABS
ORAL_TABLET | ORAL | 1 refills | Status: DC
  Filled 2020-08-20: qty 90, 90d supply, fill #0
  Filled 2020-11-20: qty 90, 90d supply, fill #1

## 2020-08-21 ENCOUNTER — Other Ambulatory Visit (HOSPITAL_COMMUNITY): Payer: Self-pay

## 2020-08-21 MED FILL — Zolpidem Tartrate Tab ER 6.25 MG: ORAL | 30 days supply | Qty: 30 | Fill #2 | Status: AC

## 2020-09-20 ENCOUNTER — Other Ambulatory Visit (HOSPITAL_COMMUNITY): Payer: Self-pay

## 2020-09-20 MED FILL — Zolpidem Tartrate Tab ER 6.25 MG: ORAL | 30 days supply | Qty: 30 | Fill #3 | Status: AC

## 2020-10-02 DIAGNOSIS — H5213 Myopia, bilateral: Secondary | ICD-10-CM | POA: Diagnosis not present

## 2020-10-10 DIAGNOSIS — R195 Other fecal abnormalities: Secondary | ICD-10-CM | POA: Diagnosis not present

## 2020-10-10 DIAGNOSIS — R198 Other specified symptoms and signs involving the digestive system and abdomen: Secondary | ICD-10-CM | POA: Diagnosis not present

## 2020-10-12 DIAGNOSIS — R195 Other fecal abnormalities: Secondary | ICD-10-CM | POA: Diagnosis not present

## 2020-10-22 ENCOUNTER — Other Ambulatory Visit (HOSPITAL_COMMUNITY): Payer: Self-pay

## 2020-10-23 ENCOUNTER — Other Ambulatory Visit (HOSPITAL_COMMUNITY): Payer: Self-pay

## 2020-10-23 MED ORDER — ZOLPIDEM TARTRATE ER 6.25 MG PO TBCR
6.2500 mg | EXTENDED_RELEASE_TABLET | Freq: Every evening | ORAL | 2 refills | Status: DC | PRN
  Filled 2021-02-20: qty 30, 30d supply, fill #0
  Filled 2021-04-08: qty 30, 30d supply, fill #1

## 2020-10-26 ENCOUNTER — Other Ambulatory Visit (HOSPITAL_COMMUNITY): Payer: Self-pay

## 2020-10-26 DIAGNOSIS — R140 Abdominal distension (gaseous): Secondary | ICD-10-CM | POA: Diagnosis not present

## 2020-10-26 DIAGNOSIS — R198 Other specified symptoms and signs involving the digestive system and abdomen: Secondary | ICD-10-CM | POA: Diagnosis not present

## 2020-10-26 MED ORDER — DICYCLOMINE HCL 20 MG PO TABS
20.0000 mg | ORAL_TABLET | Freq: Three times a day (TID) | ORAL | 0 refills | Status: DC | PRN
  Filled 2020-10-26: qty 90, 30d supply, fill #0

## 2020-10-30 ENCOUNTER — Ambulatory Visit
Admission: RE | Admit: 2020-10-30 | Discharge: 2020-10-30 | Disposition: A | Discharge status: Home / Self Care | Payer: 59 | Source: Ambulatory Visit | Attending: Physician Assistant | Admitting: Physician Assistant

## 2020-10-30 ENCOUNTER — Other Ambulatory Visit: Payer: Self-pay | Admitting: Physician Assistant

## 2020-10-30 DIAGNOSIS — R198 Other specified symptoms and signs involving the digestive system and abdomen: Secondary | ICD-10-CM

## 2020-10-30 DIAGNOSIS — R197 Diarrhea, unspecified: Secondary | ICD-10-CM | POA: Diagnosis not present

## 2020-10-30 DIAGNOSIS — K5900 Constipation, unspecified: Secondary | ICD-10-CM | POA: Diagnosis not present

## 2020-11-19 ENCOUNTER — Other Ambulatory Visit (HOSPITAL_COMMUNITY): Payer: Self-pay

## 2020-11-19 MED FILL — Estradiol Tab 1 MG: ORAL | 90 days supply | Qty: 90 | Fill #1 | Status: AC

## 2020-11-20 ENCOUNTER — Other Ambulatory Visit (HOSPITAL_COMMUNITY): Payer: Self-pay

## 2020-11-20 LAB — BASIC METABOLIC PANEL
BUN: 17 (ref 4–21)
CO2: 24 — AB (ref 13–22)
Chloride: 101 (ref 99–108)
Creatinine: 0.7 (ref 0.5–1.1)
Glucose: 95
Potassium: 4.4 mEq/L (ref 3.5–5.1)
Sodium: 138 (ref 137–147)

## 2020-11-20 LAB — HEPATIC FUNCTION PANEL
ALT: 13 U/L (ref 7–35)
AST: 16 (ref 13–35)
Alkaline Phosphatase: 95 (ref 25–125)
Bilirubin, Total: 0.3

## 2020-11-20 LAB — CBC AND DIFFERENTIAL
Hemoglobin: 13.8 (ref 12.0–16.0)
Platelets: 247 10*3/uL (ref 150–400)
WBC: 7.6

## 2020-12-17 ENCOUNTER — Other Ambulatory Visit (HOSPITAL_COMMUNITY): Payer: Self-pay

## 2020-12-17 MED ORDER — DICYCLOMINE HCL 20 MG PO TABS
20.0000 mg | ORAL_TABLET | Freq: Three times a day (TID) | ORAL | 0 refills | Status: DC
  Filled 2020-12-17: qty 90, 30d supply, fill #0

## 2020-12-18 ENCOUNTER — Other Ambulatory Visit (HOSPITAL_COMMUNITY): Payer: Self-pay

## 2020-12-18 MED ORDER — CARESTART COVID-19 HOME TEST VI KIT
PACK | 0 refills | Status: DC
  Filled 2020-12-18: qty 4, 4d supply, fill #0

## 2021-01-01 DIAGNOSIS — R197 Diarrhea, unspecified: Secondary | ICD-10-CM | POA: Diagnosis not present

## 2021-01-01 DIAGNOSIS — R1030 Lower abdominal pain, unspecified: Secondary | ICD-10-CM | POA: Diagnosis not present

## 2021-01-01 DIAGNOSIS — K5900 Constipation, unspecified: Secondary | ICD-10-CM | POA: Diagnosis not present

## 2021-01-21 ENCOUNTER — Other Ambulatory Visit (HOSPITAL_COMMUNITY): Payer: Self-pay

## 2021-02-19 ENCOUNTER — Other Ambulatory Visit (HOSPITAL_COMMUNITY): Payer: Self-pay

## 2021-02-20 ENCOUNTER — Other Ambulatory Visit (HOSPITAL_COMMUNITY): Payer: Self-pay

## 2021-02-20 MED ORDER — PRAMIPEXOLE DIHYDROCHLORIDE 0.125 MG PO TABS
0.1250 mg | ORAL_TABLET | Freq: Every day | ORAL | 1 refills | Status: DC
  Filled 2021-02-20: qty 90, 90d supply, fill #0
  Filled 2021-05-24: qty 90, 90d supply, fill #1

## 2021-02-20 MED ORDER — ZOLPIDEM TARTRATE ER 6.25 MG PO TBCR
6.2500 mg | EXTENDED_RELEASE_TABLET | Freq: Every evening | ORAL | 5 refills | Status: DC
  Filled 2021-02-20 – 2021-05-14 (×2): qty 30, 30d supply, fill #0
  Filled 2021-06-17: qty 30, 30d supply, fill #1
  Filled 2021-07-17: qty 30, 30d supply, fill #2
  Filled 2021-08-16: qty 21, 21d supply, fill #3
  Filled 2021-08-16: qty 9, 9d supply, fill #3

## 2021-02-21 ENCOUNTER — Other Ambulatory Visit (HOSPITAL_COMMUNITY): Payer: Self-pay

## 2021-02-21 MED ORDER — CARESTART COVID-19 HOME TEST VI KIT
PACK | 0 refills | Status: DC
  Filled 2021-02-21: qty 4, 4d supply, fill #0

## 2021-02-28 DIAGNOSIS — M542 Cervicalgia: Secondary | ICD-10-CM | POA: Diagnosis not present

## 2021-02-28 DIAGNOSIS — M25512 Pain in left shoulder: Secondary | ICD-10-CM | POA: Diagnosis not present

## 2021-03-07 ENCOUNTER — Ambulatory Visit (INDEPENDENT_AMBULATORY_CARE_PROVIDER_SITE_OTHER): Payer: 59 | Admitting: Family Medicine

## 2021-03-07 ENCOUNTER — Ambulatory Visit: Payer: Self-pay

## 2021-03-07 ENCOUNTER — Ambulatory Visit (INDEPENDENT_AMBULATORY_CARE_PROVIDER_SITE_OTHER): Payer: 59

## 2021-03-07 ENCOUNTER — Encounter: Payer: Self-pay | Admitting: Family Medicine

## 2021-03-07 ENCOUNTER — Other Ambulatory Visit: Payer: Self-pay

## 2021-03-07 VITALS — BP 130/82 | HR 68 | Ht 64.0 in | Wt 194.4 lb

## 2021-03-07 DIAGNOSIS — M25512 Pain in left shoulder: Secondary | ICD-10-CM

## 2021-03-07 DIAGNOSIS — M7502 Adhesive capsulitis of left shoulder: Secondary | ICD-10-CM | POA: Diagnosis not present

## 2021-03-07 NOTE — Patient Instructions (Addendum)
Nice to meet you today.  You had a L shoulder injection.  Call or go to the ER if you develop a large red swollen joint with extreme pain or oozing puss.   I've referred you to Physical Therapy.  That office will call you to schedule but please let us know if you haven't heard from them within one week regarding scheduling.  Follow-up: 6 weeks

## 2021-03-07 NOTE — Progress Notes (Signed)
I, Wendy Poet, LAT, ATC, am serving as scribe for Dr. Lynne Leader.  Subjective:    CC: L shoulder pain  HPI: Pt is a 56 y/o female c/o L shoulder pain x one month w/ no known MOI. Pt locates pain to her L ant and lateral shoulder.  Neck pain: No Radiates: yes into her L upper trap and L upper arm Mechanical symptoms: no UE Numbness/tingling: no Aggravates: L shoulder aBd past 75 deg; L shoulder flexion past 120 deg; functional IR/ER; laying on her L side Treatments tried: IBU; Biofreeze; heat  Pertinent review of Systems: No fevers or chills  Relevant historical information: Family history of ovarian cancer.   Objective:    Vitals:   03/07/21 1537  BP: 130/82  Pulse: 68  SpO2: 96%   General: Well Developed, well nourished, and in no acute distress.   MSK: Left shoulder: Normal-appearing Nontender. Limited abduction external rotation and internal rotation. Intact strength. Impingement tests are negative. Negative Yergason's and speeds test.  Lab and Radiology Results No results found for this or any previous visit (from the past 72 hour(s)). DG Shoulder Left  Result Date: 03/08/2021 CLINICAL DATA:  Left shoulder pain. EXAM: LEFT SHOULDER - 2+ VIEW COMPARISON:  Chest radiograph dated 03/31/2008. FINDINGS: There is no evidence of fracture or dislocation. There is no evidence of arthropathy or other focal bone abnormality. Soft tissues are unremarkable. IMPRESSION: Negative. Electronically Signed   By: Anner Crete M.D.   On: 03/08/2021 02:39    Procedure: Real-time Ultrasound Guided Injection of left shoulder glenohumeral joint posterior approach posterior approach Device: Philips Affiniti 50G Images permanently stored and available for review in PACS Verbal informed consent obtained.  Discussed risks and benefits of procedure. Warned about infection bleeding damage to structures skin hypopigmentation and fat atrophy among others. Patient expresses  understanding and agreement Time-out conducted.   Noted no overlying erythema, induration, or other signs of local infection.   Skin prepped in a sterile fashion.   Local anesthesia: Topical Ethyl chloride.   With sterile technique and under real time ultrasound guidance: 40 mg of Kenalog and 2 mL of Marcaine injected into joint. Fluid seen entering the capsule.   Completed without difficulty   Pain immediately resolved suggesting accurate placement of the medication.   Advised to call if fevers/chills, erythema, induration, drainage, or persistent bleeding.   Images permanently stored and available for review in the ultrasound unit.  Impression: Technically successful ultrasound guided injection.      Impression and Recommendations:    Assessment and Plan: 56 y.o. female with left shoulder pain thought to be due adhesive capsulitis.  Patient has loss of range of motion in multiple planes associated with pain.  Plan for glenohumeral injection.  She had significant improvement after injection.  Plan for physical therapy and injection as above.  Recheck in 6 weeks.  PDMP not reviewed this encounter. Orders Placed This Encounter  Procedures   DG Shoulder Left    Standing Status:   Future    Number of Occurrences:   1    Standing Expiration Date:   04/07/2021    Order Specific Question:   Reason for Exam (SYMPTOM  OR DIAGNOSIS REQUIRED)    Answer:   L shoulder pain    Order Specific Question:   Is patient pregnant?    Answer:   No    Order Specific Question:   Preferred imaging location?    Answer:   [facility]  [place] LIMITED JOINT SPACE STRUCTURES UP LEFT(NO LINKED CHARGES)    Order Specific Question:   Reason for Exam (SYMPTOM  OR DIAGNOSIS REQUIRED)    Answer:   L shoulder pain    Order Specific Question:   Preferred imaging location?    Answer:   [facility]   Ambulatory referral to Physical Therapy    Referral Priority:   Routine    Referral  Type:   Physical Medicine    Referral Reason:   Specialty Services Required    Requested Specialty:   Physical Therapy    Number of Visits Requested:   1   No orders of the defined types were placed in this encounter.   Discussed warning signs or symptoms. Please see discharge instructions. Patient expresses understanding.   The above documentation has been reviewed and is accurate and complete Lynne Leader, M.D.

## 2021-03-08 NOTE — Progress Notes (Signed)
Left shoulder x-ray shows no arthritis.  No fractures.

## 2021-03-11 ENCOUNTER — Other Ambulatory Visit (HOSPITAL_COMMUNITY): Payer: Self-pay

## 2021-03-11 MED FILL — Estradiol Tab 1 MG: ORAL | 90 days supply | Qty: 90 | Fill #2 | Status: AC

## 2021-03-12 ENCOUNTER — Other Ambulatory Visit (HOSPITAL_COMMUNITY): Payer: Self-pay

## 2021-03-13 ENCOUNTER — Other Ambulatory Visit: Payer: Self-pay

## 2021-03-13 ENCOUNTER — Encounter: Payer: Self-pay | Admitting: Physical Therapy

## 2021-03-13 ENCOUNTER — Ambulatory Visit (INDEPENDENT_AMBULATORY_CARE_PROVIDER_SITE_OTHER): Payer: 59 | Admitting: Physical Therapy

## 2021-03-13 DIAGNOSIS — M25512 Pain in left shoulder: Secondary | ICD-10-CM

## 2021-03-13 DIAGNOSIS — M25612 Stiffness of left shoulder, not elsewhere classified: Secondary | ICD-10-CM | POA: Diagnosis not present

## 2021-03-13 NOTE — Therapy (Signed)
OUTPATIENT PHYSICAL THERAPY SHOULDER EVALUATION   Patient Name: Deborah Martin MRN: [id number] DOB:10/27/1965, 56 y.o., female Today's Date: 03/13/2021   PT End of Session - 03/13/21 1854     Visit Number 1    Number of Visits 16    Date for PT Re-Evaluation 05/08/21    Authorization Type [facility]- [facility]    PT Start Time 0805    PT Stop Time 0843    PT Time Calculation (min) 38 min    Activity Tolerance Patient tolerated treatment well    Behavior During Therapy WFL for tasks assessed/performed             Past Medical History:  Diagnosis Date   Anxiety    Arthritis    lower back   Restless legs    Past Surgical History:  Procedure Laterality Date   CESAREAN SECTION     LAPAROSCOPIC VAGINAL HYSTERECTOMY WITH SALPINGO OOPHORECTOMY Bilateral 03/06/2016   Procedure: LAPAROSCOPIC ASSISTED VAGINAL HYSTERECTOMY WITH BILATERAL SALPINGO OOPHORECTOMY;  Surgeon: Everlene Farrier, MD;  Location: [facility];  Service: Gynecology;  Laterality: Bilateral;   Patient Active Problem List   Diagnosis Date Noted   Family history of ovarian cancer 03/06/2016    PCP: Darcus Austin, MD (Inactive)  REFERRING PROVIDER: Gregor Hams, MD  REFERRING DIAG: L shoulder pain , adhesive capsulitis  THERAPY DIAG:  Acute pain of left shoulder  Stiffness of left shoulder, not elsewhere classified   ONSET DATE: 5 weeks ago  SUBJECTIVE:                                                                                                                                                                                      SUBJECTIVE STATEMENT: Pt states onset of L shoulder pain, about 5 weeks ago. No incident to report. Has pain at night, sleeping on it, reaching behind back. Did have injection, notes some relief. No previous shoulder pain. R handed. Works full time. Not currently exercising, but would like to get back to this.   PERTINENT HISTORY: None   PAIN:  Are you having pain? Yes NPRS scale:  6/10 Pain location: Shoulder  Pain orientation: Left  PAIN TYPE: aching Pain description: intermittent  Aggravating factors: lifting, elevation, abd, behind back motions, sleeping.  Relieving factors: Rest, heat,   PRECAUTIONS: None  WEIGHT BEARING RESTRICTIONS No  FALLS:  Has patient fallen in last 6 months? No Number of falls: 0   PLOF: Independent  PATIENT GOALS  Decreased pain, improved use of arm, return to working out.   OBJECTIVE:   DIAGNOSTIC FINDINGS:  X-ray, neg    COGNITION:  Overall cognitive status: Within functional limits  for tasks assessed      PALPATION: Hypomobile L GHJ, minimal pain with palpation   UPPER EXTREMITY AROM/PROM:  A/PROM Right 03/13/2021 Left 03/13/2021  Shoulder flexion  125/145  Shoulder extension    Shoulder abduction  70/130  Shoulder adduction    Shoulder internal rotation  50/50  Shoulder external rotation  48/48  Elbow flexion    Elbow extension        Neck ROM  WNL                  (Blank rows = not tested)  UPPER EXTREMITY MMT:  MMT Right 03/13/2021 Left 03/13/2021  Shoulder flexion  4- (at mid range)  Shoulder extension    Shoulder abduction  4- (at mid range)   Shoulder adduction    IR  4  ER  4                                  Grip strength (lbs)    (Blank rows = not tested)    TODAY'S TREATMENT:  03/13/21:  Therapeutic Exercise:  Aerobic: Supine:  shoulder flexion AAROM/cane x 15; Shoulder ER butterfly 5sec x 10; ER AAROm/cane x 15;  Seated: Standing: wall slides 2 UE x 15;  Flexion stretch at counter 10 sec x 3; IR 2 hands behind back x 10;  Stretches:   Neuromuscular Re-education: Manual Therapy: Therapeutic Activity: Self Care:    PATIENT EDUCATION: Education details: PT POC, Exam findings, Initial HEP Person educated: Patient Education method: Explanation, Demonstration, Tactile cues, Verbal cues, and Handouts Education comprehension: verbalized understanding, returned  demonstration, verbal cues required, tactile cues required, and needs further education   HOME EXERCISE PROGRAM: Access Code: [email or web address] URL: [email or web address] Date: 03/13/2021 Prepared by: Lyndee Hensen  Exercises Supine Shoulder Flexion Extension AAROM with Dowel - 2 x daily - 1 sets - 10-15 reps - 5 hold Supine Shoulder External Rotation in 45 Degrees Abduction AAROM with Dowel - 2 x daily - 1 sets - 10-15 reps - 5 hold Supine Chest Stretch with Elbows Bent - 2 x daily - 1 sets - 10 reps - 5 hold Shoulder Flexion Wall Slide with Towel - 2 x daily - 1 sets - 10 reps Standing Shoulder Internal Rotation Stretch with Hands Behind Back - 2 x daily - 1 sets - 5-10 reps - 5 hold Standing 'L' Stretch at Counter - 2 x daily - 3 reps - 10 hold   ASSESSMENT:  CLINICAL IMPRESSION: 03/13/21: Pt presents with primary complaint of increased pain in L shoulder. She has joint stiffness and limitation for ROM in all motions, consistent with adhesive capsulitis. Most limited is IR and ER. Pt with some improvement of ability for flexion with ther ex today. She has significant limitation for elevation, lifting, carrying, ADLs and IADLs. Pt to benefit from skilled PT to improve deficits and pain.   Objective impairments include decreased activity tolerance, decreased knowledge of use of DME, decreased mobility, decreased ROM, decreased strength, hypomobility, increased muscle spasms, impaired UE functional use, and pain. These impairments are limiting patient from cleaning, community activity, driving, meal prep, occupation, laundry, yard work, and shopping. Personal factors including  None  are also affecting patient's functional outcome. Patient will benefit from skilled PT to address above impairments and improve overall function.  REHAB POTENTIAL: Good  CLINICAL DECISION MAKING: Stable/uncomplicated  EVALUATION COMPLEXITY: Low   GOALS: Goals reviewed with patient? Yes  SHORT TERM  GOALS:  STG Name Target Date Goal status  1 Patient will be independent with initial HEP   03/27/21 INITIAL  2 Pt to demo improved AROM of shoulder flexion to be improved by at least 10 degrees   03/27/21 INITIAL  3  Pt to demo improved ER AROM by at least 8 degrees  03/27/21 INTIAL   LONG TERM GOALS:   LTG Name Target Date Goal status  1 Patient will be independent with final HEP  05/08/21 INITIAL  2 Pt to report decreased pain in shoulder to 0-2/10 with reaching, lifting, carrying to improve ability for IADLs.   05/08/21 INITIAL  3 Pt to demonstrate improved strength of shoulder to be at least 4+/5, to improve ability for lift, carry and overhead activity.   05/08/21 INITIAL  4  Pt to demo improved AROM in L shoulder, to be WNL, to improve ability for ADLS.  05/08/21 INITIAL  5        PLAN: PT FREQUENCY: 2x/week  PT DURATION: 8 weeks  PLANNED INTERVENTIONS: Therapeutic exercises, Therapeutic activity, Neuro Muscular re-education, Patient/Family education, Joint mobilization, Dry Needling, Electrical stimulation, Cryotherapy, Moist heat, Taping, Traction, Ultrasound, Ionotophoresis 4mg /ml Dexamethasone, and Manual therapy  PLAN FOR NEXT SESSION: Progress AAROM in all directions, manual, joint mobs, PROM for improving ROM>   Lyndee Hensen, PT, DPT 7:16 PM  03/13/21

## 2021-03-18 ENCOUNTER — Other Ambulatory Visit: Payer: Self-pay

## 2021-03-18 ENCOUNTER — Ambulatory Visit (INDEPENDENT_AMBULATORY_CARE_PROVIDER_SITE_OTHER): Payer: 59 | Admitting: Physical Therapy

## 2021-03-18 ENCOUNTER — Encounter: Payer: Self-pay | Admitting: Physical Therapy

## 2021-03-18 DIAGNOSIS — M25612 Stiffness of left shoulder, not elsewhere classified: Secondary | ICD-10-CM

## 2021-03-18 DIAGNOSIS — M25512 Pain in left shoulder: Secondary | ICD-10-CM | POA: Diagnosis not present

## 2021-03-18 NOTE — Therapy (Signed)
OUTPATIENT PHYSICAL THERAPY TREATMENT    Patient Name: Deborah Martin MRN: [id number] DOB:12/08/1965, 56 y.o., female Today's Date: 03/18/2021   PT End of Session - 03/18/21 0925     Visit Number 2    Number of Visits 16    Date for PT Re-Evaluation 05/08/21    Authorization Type [facility]- [employer]    PT Start Time [phone]    PT Stop Time 0922    PT Time Calculation (min) 39 min    Activity Tolerance Patient tolerated treatment well    Behavior During Therapy [facility] for tasks assessed/performed              Past Medical History:  Diagnosis Date   Anxiety    Arthritis    lower back   Restless legs    Past Surgical History:  Procedure Laterality Date   CESAREAN SECTION     LAPAROSCOPIC VAGINAL HYSTERECTOMY WITH SALPINGO OOPHORECTOMY Bilateral 03/06/2016   Procedure: LAPAROSCOPIC ASSISTED VAGINAL HYSTERECTOMY WITH BILATERAL SALPINGO OOPHORECTOMY;  Surgeon: Everlene Farrier, MD;  Location: [facility];  Service: Gynecology;  Laterality: Bilateral;   Patient Active Problem List   Diagnosis Date Noted   Family history of ovarian cancer 03/06/2016    PCP: Darcus Austin, MD (Inactive)  REFERRING PROVIDER:Evan Lonna Cobb DIAG: L shoulder pain , adhesive capsulitis  THERAPY DIAG:  Acute pain of left shoulder  Stiffness of left shoulder, not elsewhere classified   ONSET DATE: 5 weeks ago  SUBJECTIVE:                                                                                                                                                                                      SUBJECTIVE STATEMENT: Pt has been doing HEP. Thinks it is moving farther for elevation. Has pain with IR/ER movements.   PERTINENT HISTORY: None   PAIN:  Are you having pain? Yes NPRS scale: 6/10 Pain location: Shoulder  Pain orientation: Left  PAIN TYPE: aching Pain description: intermittent  Aggravating factors: lifting, elevation, abd, behind back motions, sleeping.  Relieving factors: Rest,  heat,   PRECAUTIONS: None    PLOF: Independent  PATIENT GOALS  Decreased pain, improved use of arm, return to working out.   OBJECTIVE:   DIAGNOSTIC FINDINGS:  X-ray, neg     UPPER EXTREMITY AROM/PROM:  A/PROM Right 03/18/2021 Left 03/18/2021 03/18/21 LEFT  Shoulder flexion  125/145 /150  Shoulder extension     Shoulder abduction  70/130   Shoulder adduction     Shoulder internal rotation  50/50 /55  Shoulder external rotation  48/48 /50  Elbow flexion     Elbow extension  Neck ROM  WNL                       (Blank rows = not tested)  UPPER EXTREMITY MMT:  MMT Right 03/18/2021 Left 03/18/2021  Shoulder flexion  4- (at mid range)  Shoulder extension    Shoulder abduction  4- (at mid range)   Shoulder adduction    IR  4  ER  4                                  Grip strength (lbs)    (Blank rows = not tested)    TODAY'S TREATMENT:   03/18/21: Therapeutic Exercise: Aerobic: Supine:  shoulder flexion AAROM/cane x 15; Shoulder ER butterfly 5sec x 10; ER AAROm/cane x 15; IR/ER AROM x 20;  Seated: Standing: wall slides 1 UE x 15;  ER stretch at wall 10 sec x 5 (for HEP);  ER stretch seated with arm on table 10 sec x 5;  Stretches:   Neuromuscular Re-education: Manual Therapy:   PROM GHJ, all motions  Therapeutic Activity: Self Care:     03/13/21:  Therapeutic Exercise:  Aerobic: Supine:  shoulder flexion AAROM/cane x 15; Shoulder ER butterfly 5sec x 10; ER AAROm/cane x 15;  Seated:  Pulley flex and scaption x 2 min ea;  Standing: wall slides 2 UE x 15;  Flexion stretch at counter 10 sec x 3; IR 2 hands behind back x 10;  Stretches:   Neuromuscular Re-education: Manual Therapy: Therapeutic Activity: Self Care:    PATIENT EDUCATION: Education details: updated and reviewed HEP Person educated: Patient Education method: Explanation, Demonstration, Tactile cues, Verbal cues, and Handouts Education comprehension: verbalized  understanding, returned demonstration, verbal cues required, tactile cues required, and needs further education   HOME EXERCISE PROGRAM: Access Code: [email or web address]    ASSESSMENT:  CLINICAL IMPRESSION: 03/18/21: Pt with some improvements with ROM for all motions today. Improved pain and ROM of behind the back IR after session today, and improved ability for flexion. Most soreness with ER today, added other stretches for HEP for this. Plan to progress ROM as able.   Objective impairments include decreased activity tolerance, decreased knowledge of use of DME, decreased mobility, decreased ROM, decreased strength, hypomobility, increased muscle spasms, impaired UE functional use, and pain. These impairments are limiting patient from cleaning, community activity, driving, meal prep, occupation, laundry, yard work, and shopping. Personal factors including  None  are also affecting patient's functional outcome. Patient will benefit from skilled PT to address above impairments and improve overall function.  REHAB POTENTIAL: Good  CLINICAL DECISION MAKING: Stable/uncomplicated  EVALUATION COMPLEXITY: Low   GOALS: Goals reviewed with patient? Yes  SHORT TERM GOALS:  STG Name Target Date Goal status  1 Patient will be independent with initial HEP   03/27/21 INITIAL  2 Pt to demo improved AROM of shoulder flexion to be improved by at least 10 degrees   03/27/21 INITIAL  3  Pt to demo improved ER AROM by at least 8 degrees  03/27/21 INTIAL   LONG TERM GOALS:   LTG Name Target Date Goal status  1 Patient will be independent with final HEP  05/08/21 INITIAL  2 Pt to report decreased pain in shoulder to 0-2/10 with reaching, lifting, carrying to improve ability for IADLs.   05/08/21 INITIAL  3 Pt to demonstrate improved strength of shoulder to be at least 4+/5,  to improve ability for lift, carry and overhead activity.   05/08/21 INITIAL  4  Pt to demo improved AROM in L shoulder, to be WNL, to improve  ability for ADLS.  05/08/21 INITIAL  5        PLAN: PT FREQUENCY: 2x/week  PT DURATION: 8 weeks  PLANNED INTERVENTIONS: Therapeutic exercises, Therapeutic activity, Neuro Muscular re-education, Patient/Family education, Joint mobilization, Dry Needling, Electrical stimulation, Cryotherapy, Moist heat, Taping, Traction, Ultrasound, Ionotophoresis 4mg /ml Dexamethasone, and Manual therapy  PLAN FOR NEXT SESSION: Progress AAROM in all directions, manual, joint mobs, PROM for improving ROM>   Lyndee Hensen, PT, DPT 9:26 AM  03/18/21

## 2021-03-21 ENCOUNTER — Encounter: Payer: Self-pay | Admitting: Physical Therapy

## 2021-03-21 ENCOUNTER — Ambulatory Visit (INDEPENDENT_AMBULATORY_CARE_PROVIDER_SITE_OTHER): Payer: 59 | Admitting: Physical Therapy

## 2021-03-21 DIAGNOSIS — M25512 Pain in left shoulder: Secondary | ICD-10-CM | POA: Diagnosis not present

## 2021-03-21 DIAGNOSIS — M25612 Stiffness of left shoulder, not elsewhere classified: Secondary | ICD-10-CM

## 2021-03-21 NOTE — Therapy (Signed)
OUTPATIENT PHYSICAL THERAPY TREATMENT    Patient Name: MALYSSA MARIS MRN: [id number] DOB:Jun 28, 1965, 56 y.o., female Today's Date: 03/21/2021   PT End of Session - 03/21/21 0839     Visit Number 3    Number of Visits 16    Date for PT Re-Evaluation 05/08/21    Authorization Type [facility]- [facility]    PT Start Time [phone]    PT Stop Time 0926    PT Time Calculation (min) 44 min    Activity Tolerance Patient tolerated treatment well    Behavior During Therapy [facility] for tasks assessed/performed              Past Medical History:  Diagnosis Date   Anxiety    Arthritis    lower back   Restless legs    Past Surgical History:  Procedure Laterality Date   CESAREAN SECTION     LAPAROSCOPIC VAGINAL HYSTERECTOMY WITH SALPINGO OOPHORECTOMY Bilateral 03/06/2016   Procedure: LAPAROSCOPIC ASSISTED VAGINAL HYSTERECTOMY WITH BILATERAL SALPINGO OOPHORECTOMY;  Surgeon: Everlene Farrier, MD;  Location: [facility];  Service: Gynecology;  Laterality: Bilateral;   Patient Active Problem List   Diagnosis Date Noted   Family history of ovarian cancer 03/06/2016    PCP: Darcus Austin, MD (Inactive)  REFERRING PROVIDER:Evan Lonna Cobb DIAG: L shoulder pain , adhesive capsulitis  THERAPY DIAG:  Acute pain of left shoulder  Stiffness of left shoulder, not elsewhere classified   ONSET DATE: 5 weeks ago  SUBJECTIVE:                                                                                                                                                                                      SUBJECTIVE STATEMENT: Pt with little pain at rest and with sleeping (sleeping better). She has soreness with ROM into ER and IR. Doing better with flexion.   PERTINENT HISTORY: None   PAIN:  Are you having pain? Yes NPRS scale: 5/10 Pain location: Shoulder  Pain orientation: Left  PAIN TYPE: aching Pain description: intermittent  Aggravating factors: lifting, elevation, abd, behind back motions,  sleeping.  Relieving factors: Rest, heat,   PRECAUTIONS: None    PLOF: Independent  PATIENT GOALS  Decreased pain, improved use of arm, return to working out.   OBJECTIVE:   DIAGNOSTIC FINDINGS:  X-ray, neg     UPPER EXTREMITY AROM/PROM:  A/PROM Right 03/13/2021 Left 03/13/2021 03/18/21 LEFT LEFT  Shoulder flexion  125/145 /150   Shoulder extension      Shoulder abduction  70/130    Shoulder adduction      Shoulder internal rotation  50/50 /55   Shoulder external rotation  48/48 /50  Elbow flexion      Elbow extension            Neck ROM  WNL                            (Blank rows = not tested)  UPPER EXTREMITY MMT:  MMT Right 03/13/2021 Left 03/13/2021  Shoulder flexion  4- (at mid range)  Shoulder extension    Shoulder abduction  4- (at mid range)   Shoulder adduction    IR  4  ER  4                                  Grip strength (lbs)    (Blank rows = not tested)    TODAY'S TREATMENT:   03/21/21: Therapeutic Exercise: Aerobic: Supine:  shoulder flexion AAROM/cane x 15;  ER/cane x 15; Shoulder ER butterfly 5sec x 10;   ER AROM at 75 deg x 15;   Seated: Pulley flex and scaption x 2 min ea;  Standing:    AROM flexion and abduction/full range x 15 each;  Stretches:   ER stretch seated with arm on table 10 sec x 5;  IR behind the back with stick, extension, to the side, and IR;  Doorway pec stretch 45 deg 20 sec x 5;   Neuromuscular Re-education: Manual Therapy:   PROM GHJ, all motions . MWM for behind the back IR in standing with strap . Therapeutic Activity: Self Care:     03/18/21: Therapeutic Exercise: Aerobic: Supine:  shoulder flexion AAROM/cane x 15; Shoulder ER butterfly 5sec x 10; ER AAROm/cane x 15; IR/ER AROM x 20;  Seated: Standing: wall slides 1 UE x 15;  ER stretch at wall 10 sec x 5 (for HEP);  ER stretch seated with arm on table 10 sec x 5;  Stretches:   Neuromuscular Re-education: Manual Therapy:   PROM GHJ, all  motions  Therapeutic Activity: Self Care:     03/13/21:  Therapeutic Exercise:  Aerobic: Supine:  shoulder flexion AAROM/cane x 15; Shoulder ER butterfly 5sec x 10; ER AAROm/cane x 15;  Seated:  Pulley flex and scaption x 2 min ea;  Standing: wall slides 2 UE x 15;  Flexion stretch at counter 10 sec x 3; IR 2 hands behind back x 10;  Stretches:   Neuromuscular Re-education: Manual Therapy: Therapeutic Activity: Self Care:    PATIENT EDUCATION: Education details: updated and reviewed HEP Person educated: Patient Education method: Explanation, Demonstration, Tactile cues, Verbal cues, and Handouts Education comprehension: verbalized understanding, returned demonstration, verbal cues required, tactile cues required, and needs further education   HOME EXERCISE PROGRAM: Access Code: [email or web address]    ASSESSMENT:  CLINICAL IMPRESSION: 03/21/21: Pt making some improvements in ROM for all motions. IR/ER still painful at end of available ranges. Much improved ability for shoulder AROM for flex and abd today. Plan to progress ROM as tolerated.   Objective impairments include decreased activity tolerance, decreased knowledge of use of DME, decreased mobility, decreased ROM, decreased strength, hypomobility, increased muscle spasms, impaired UE functional use, and pain. These impairments are limiting patient from cleaning, community activity, driving, meal prep, occupation, laundry, yard work, and shopping. Personal factors including  None  are also affecting patient's functional outcome. Patient will benefit from skilled PT to address above impairments and improve overall function.  REHAB POTENTIAL: Good  CLINICAL DECISION MAKING: Stable/uncomplicated  EVALUATION COMPLEXITY: Low   GOALS: Goals reviewed with patient? Yes  SHORT TERM GOALS:  STG Name Target Date Goal status  1 Patient will be independent with initial HEP   03/27/21 INITIAL  2 Pt to demo improved AROM of shoulder  flexion to be improved by at least 10 degrees   03/27/21 INITIAL  3  Pt to demo improved ER AROM by at least 8 degrees  03/27/21 INTIAL   LONG TERM GOALS:   LTG Name Target Date Goal status  1 Patient will be independent with final HEP  05/08/21 INITIAL  2 Pt to report decreased pain in shoulder to 0-2/10 with reaching, lifting, carrying to improve ability for IADLs.   05/08/21 INITIAL  3 Pt to demonstrate improved strength of shoulder to be at least 4+/5, to improve ability for lift, carry and overhead activity.   05/08/21 INITIAL  4  Pt to demo improved AROM in L shoulder, to be WNL, to improve ability for ADLS.  05/08/21 INITIAL  5        PLAN: PT FREQUENCY: 2x/week  PT DURATION: 8 weeks  PLANNED INTERVENTIONS: Therapeutic exercises, Therapeutic activity, Neuro Muscular re-education, Patient/Family education, Joint mobilization, Dry Needling, Electrical stimulation, Cryotherapy, Moist heat, Taping, Traction, Ultrasound, Ionotophoresis 4mg /ml Dexamethasone, and Manual therapy  PLAN FOR NEXT SESSION: Progress AAROM in all directions, manual, joint mobs, PROM for improving ROM>   Lyndee Hensen, PT, DPT 9:29 AM  03/21/21

## 2021-03-25 ENCOUNTER — Other Ambulatory Visit: Payer: Self-pay

## 2021-03-25 ENCOUNTER — Encounter: Payer: Self-pay | Admitting: Physical Therapy

## 2021-03-25 ENCOUNTER — Ambulatory Visit (INDEPENDENT_AMBULATORY_CARE_PROVIDER_SITE_OTHER): Payer: 59 | Admitting: Physical Therapy

## 2021-03-25 DIAGNOSIS — M25512 Pain in left shoulder: Secondary | ICD-10-CM

## 2021-03-25 DIAGNOSIS — M25612 Stiffness of left shoulder, not elsewhere classified: Secondary | ICD-10-CM | POA: Diagnosis not present

## 2021-03-25 NOTE — Therapy (Signed)
OUTPATIENT PHYSICAL THERAPY TREATMENT    Patient Name: Deborah Martin MRN: [id number] DOB:[birth year], 56 y.o., female Today's Date: 03/25/2021   PT End of Session - 03/25/21 0854     Visit Number 4    Number of Visits 16    Date for PT Re-Evaluation 05/08/21    Authorization Type [facility]- [facility]    PT Start Time 0803    PT Stop Time 0844    PT Time Calculation (min) 41 min    Activity Tolerance Patient tolerated treatment well    Behavior During Therapy [facility] for tasks assessed/performed              Past Medical History:  Diagnosis Date   Anxiety    Arthritis    lower back   Restless legs    Past Surgical History:  Procedure Laterality Date   CESAREAN SECTION     LAPAROSCOPIC VAGINAL HYSTERECTOMY WITH SALPINGO OOPHORECTOMY Bilateral 03/06/2016   Procedure: LAPAROSCOPIC ASSISTED VAGINAL HYSTERECTOMY WITH BILATERAL SALPINGO OOPHORECTOMY;  Surgeon: Everlene Farrier, MD;  Location: [facility];  Service: Gynecology;  Laterality: Bilateral;   Patient Active Problem List   Diagnosis Date Noted   Family history of ovarian cancer 03/06/2016    PCP: Darcus Austin, MD (Inactive)  REFERRING PROVIDER:Evan Lonna Cobb DIAG: L shoulder pain , adhesive capsulitis  THERAPY DIAG:  Acute pain of left shoulder  Stiffness of left shoulder, not elsewhere classified   ONSET DATE: 5 weeks ago  SUBJECTIVE:                                                                                                                                                                                      SUBJECTIVE STATEMENT: Pt had increased soreness on Sunday, after holding baby, grandchild for much of the day on Saturday. Pain reduced today, pt feels shes doing better with ROM.   PERTINENT HISTORY: None   PAIN:  Are you having pain? Yes NPRS scale: 4/10 Pain location: Shoulder  Pain orientation: Left  PAIN TYPE: aching Pain description: intermittent  Aggravating factors: lifting, elevation,  abd, behind back motions, sleeping.  Relieving factors: Rest, heat,   PRECAUTIONS: None    PLOF: Independent  PATIENT GOALS  Decreased pain, improved use of arm, return to working out.   OBJECTIVE:   DIAGNOSTIC FINDINGS:  X-ray, neg     UPPER EXTREMITY AROM/PROM:  A/PROM Right 03/13/2021 Left 03/13/2021 03/18/21 LEFT 03/25/21 LEFT  Shoulder flexion  125/145 /150   Shoulder extension      Shoulder abduction  70/130    Shoulder adduction      Shoulder internal rotation  50/50 /55   Shoulder external  rotation  48/48 /50 /61  Elbow flexion      Elbow extension            Neck ROM  WNL                            (Blank rows = not tested)  UPPER EXTREMITY MMT:  MMT Right 03/13/2021 Left 03/13/2021  Shoulder flexion  4- (at mid range)  Shoulder extension    Shoulder abduction  4- (at mid range)   Shoulder adduction    IR  4  ER  4                                  Grip strength (lbs)    (Blank rows = not tested)    TODAY'S TREATMENT:   03/25/21: Therapeutic Exercise: Aerobic: Supine:  shoulder flexion AAROM/cane x 15;  ER/cane x 15 at 45 and 90 deg ; Shoulder ER butterfly 5sec x 10;      Seated: Pulley flex and scaption x 2 min ea;  Standing:    AROM flexion and abduction/full range x 15 each;  Rows GTB x 20;  IR/ER RTB x 15 each;  Stretches:    IR behind the back with stick, extension, to the side, and IR;    Neuromuscular Re-education: Manual Therapy:   PROM GHJ, all motions .  Therapeutic Activity: Self Care:   03/21/21: Therapeutic Exercise: Aerobic: Supine:  shoulder flexion AAROM/cane x 15;  ER/cane x 15; Shoulder ER butterfly 5sec x 10;   ER AROM at 75 deg x 15;   Seated: Pulley flex and scaption x 2 min ea;  Standing:    AROM flexion and abduction/full range x 15 each;  Stretches:   ER stretch seated with arm on table 10 sec x 5;  IR behind the back with stick, extension, to the side, and IR;  Doorway pec stretch 45 deg 20 sec x 5;    Neuromuscular Re-education: Manual Therapy:   PROM GHJ, all motions . MWM for behind the back IR in standing with strap . Therapeutic Activity: Self Care:     03/18/21: Therapeutic Exercise: Aerobic: Supine:  shoulder flexion AAROM/cane x 15; Shoulder ER butterfly 5sec x 10; ER AAROm/cane x 15; IR/ER AROM x 20;  Seated: Standing: wall slides 1 UE x 15;  ER stretch at wall 10 sec x 5 (for HEP);  ER stretch seated with arm on table 10 sec x 5;  Stretches:   Neuromuscular Re-education: Manual Therapy:   PROM GHJ, all motions  Therapeutic Activity: Self Care:     PATIENT EDUCATION: Education details: updated and reviewed HEP Person educated: Patient Education method: Explanation, Demonstration, Tactile cues, Verbal cues, and Handouts Education comprehension: verbalized understanding, returned demonstration, verbal cues required, tactile cues required, and needs further education   HOME EXERCISE PROGRAM: Access Code: [email or web address]    ASSESSMENT:  CLINICAL IMPRESSION: 03/25/21: Pt with improving ROM in all directions. Improved IR behind back, and improved pain with this today. Improving ER, but still stiff and with mild pain at end range. Will benefit from ROM progressions to reach full ROM, and progression into strengthening as tolerated.   Objective impairments include decreased activity tolerance, decreased knowledge of use of DME, decreased mobility, decreased ROM, decreased strength, hypomobility, increased muscle spasms, impaired UE functional use, and pain. These impairments are limiting patient from cleaning,  community activity, driving, meal prep, occupation, Medical sales representative, yard work, and shopping. Personal factors including  None  are also affecting patient's functional outcome. Patient will benefit from skilled PT to address above impairments and improve overall function.  REHAB POTENTIAL: Good  CLINICAL DECISION MAKING: Stable/uncomplicated  EVALUATION COMPLEXITY:  Low   GOALS: Goals reviewed with patient? Yes  SHORT TERM GOALS:  STG Name Target Date Goal status  1 Patient will be independent with initial HEP   03/27/21 MET  2 Pt to demo improved AROM of shoulder flexion to be improved by at least 10 degrees   03/27/21 MET  3  Pt to demo improved ER AROM by at least 8 degrees  03/27/21 MET   LONG TERM GOALS:   LTG Name Target Date Goal status  1 Patient will be independent with final HEP  05/08/21 INITIAL  2 Pt to report decreased pain in shoulder to 0-2/10 with reaching, lifting, carrying to improve ability for IADLs.   05/08/21 INITIAL  3 Pt to demonstrate improved strength of shoulder to be at least 4+/5, to improve ability for lift, carry and overhead activity.   05/08/21 INITIAL  4  Pt to demo improved AROM in L shoulder, to be WNL, to improve ability for ADLS.  05/08/21 INITIAL  5        PLAN: PT FREQUENCY: 2x/week  PT DURATION: 8 weeks  PLANNED INTERVENTIONS: Therapeutic exercises, Therapeutic activity, Neuro Muscular re-education, Patient/Family education, Joint mobilization, Dry Needling, Electrical stimulation, Cryotherapy, Moist heat, Taping, Traction, Ultrasound, Ionotophoresis 12m/ml Dexamethasone, and Manual therapy  PLAN FOR NEXT SESSION: Progress AAROM in all directions, manual, joint mobs, PROM for improving ROM>   LLyndee Hensen PT, DPT 8:55 AM  03/25/21

## 2021-03-27 DIAGNOSIS — D1801 Hemangioma of skin and subcutaneous tissue: Secondary | ICD-10-CM | POA: Diagnosis not present

## 2021-03-27 DIAGNOSIS — D485 Neoplasm of uncertain behavior of skin: Secondary | ICD-10-CM | POA: Diagnosis not present

## 2021-03-27 DIAGNOSIS — D2262 Melanocytic nevi of left upper limb, including shoulder: Secondary | ICD-10-CM | POA: Diagnosis not present

## 2021-03-27 DIAGNOSIS — L814 Other melanin hyperpigmentation: Secondary | ICD-10-CM | POA: Diagnosis not present

## 2021-03-27 DIAGNOSIS — C4359 Malignant melanoma of other part of trunk: Secondary | ICD-10-CM | POA: Diagnosis not present

## 2021-03-27 DIAGNOSIS — D225 Melanocytic nevi of trunk: Secondary | ICD-10-CM | POA: Diagnosis not present

## 2021-03-29 ENCOUNTER — Encounter: Payer: Self-pay | Admitting: Physical Therapy

## 2021-03-29 ENCOUNTER — Other Ambulatory Visit: Payer: Self-pay

## 2021-03-29 ENCOUNTER — Ambulatory Visit (INDEPENDENT_AMBULATORY_CARE_PROVIDER_SITE_OTHER): Payer: 59 | Admitting: Physical Therapy

## 2021-03-29 DIAGNOSIS — M25512 Pain in left shoulder: Secondary | ICD-10-CM

## 2021-03-29 DIAGNOSIS — M25612 Stiffness of left shoulder, not elsewhere classified: Secondary | ICD-10-CM | POA: Diagnosis not present

## 2021-03-29 NOTE — Therapy (Signed)
OUTPATIENT PHYSICAL THERAPY TREATMENT    Patient Name: Deborah Martin MRN: [id number] DOB:1965/10/02, 56 y.o., female Today's Date: 03/29/2021   PT End of Session - 03/29/21 1012     Visit Number 5    Number of Visits 16    Date for PT Re-Evaluation 05/08/21    Authorization Type [facility]- [employer]    PT Start Time 0929    PT Stop Time 1008    PT Time Calculation (min) 39 min    Activity Tolerance Patient tolerated treatment well    Behavior During Therapy WFL for tasks assessed/performed               Past Medical History:  Diagnosis Date   Anxiety    Arthritis    lower back   Restless legs    Past Surgical History:  Procedure Laterality Date   CESAREAN SECTION     LAPAROSCOPIC VAGINAL HYSTERECTOMY WITH SALPINGO OOPHORECTOMY Bilateral 03/06/2016   Procedure: LAPAROSCOPIC ASSISTED VAGINAL HYSTERECTOMY WITH BILATERAL SALPINGO OOPHORECTOMY;  Surgeon: Everlene Farrier, MD;  Location: [facility];  Service: Gynecology;  Laterality: Bilateral;   Patient Active Problem List   Diagnosis Date Noted   Family history of ovarian cancer 03/06/2016    PCP: Darcus Austin, MD (Inactive)  REFERRING PROVIDER:Evan Lonna Cobb DIAG: L shoulder pain , adhesive capsulitis  THERAPY DIAG:  Acute pain of left shoulder  Stiffness of left shoulder, not elsewhere classified   ONSET DATE: 5 weeks ago  SUBJECTIVE:                                                                                                                                                                                      SUBJECTIVE STATEMENT: Pt doing very well. Has been doing HEP. Minimal pain with regular daily activities.   PERTINENT HISTORY: None   PAIN:  Are you having pain? Yes NPRS scale: 0/10 overall, up to 7/10 with end range stretching  Pain location: Shoulder  Pain orientation: Left  PAIN TYPE: aching Pain description: intermittent  Aggravating factors: lifting, elevation, abd, behind back motions,  sleeping.  Relieving factors: Rest, heat,   PRECAUTIONS: None    PLOF: Independent  PATIENT GOALS  Decreased pain, improved use of arm, return to working out.   OBJECTIVE:   DIAGNOSTIC FINDINGS:  X-ray, neg     UPPER EXTREMITY AROM/PROM:  A/PROM Right 03/13/2021 Left 03/13/2021 03/18/21 LEFT 03/25/21 LEFT  Shoulder flexion  125/145 /150   Shoulder extension      Shoulder abduction  70/130    Shoulder adduction      Shoulder internal rotation  50/50 /55   Shoulder external rotation  48/48 /  50 /61  Elbow flexion      Elbow extension            Neck ROM  WNL                            (Blank rows = not tested)  UPPER EXTREMITY MMT:  MMT Right 03/13/2021 Left 03/13/2021  Shoulder flexion  4- (at mid range)  Shoulder extension    Shoulder abduction  4- (at mid range)   Shoulder adduction    IR  4  ER  4                                  Grip strength (lbs)    (Blank rows = not tested)    TODAY'S TREATMENT:   03/29/21: Therapeutic Exercise: Aerobic: UBE 4 min/ L1 , fwd/bwd. Supine:  shoulder flexion AAROM/cane x 15;  ER 2lb at side x 15;  ER at 90 deg/ cane x 20;  Seated: Pulley flex and scaption and abd x 2 min ea;  Standing:    AROM flexion  2lb x 15 each;  Rows GTB x 20;  IR/ER RTB x 20 each;  Stretches: IR behind the back with stick, extension, ea x 15; Doorway stretch 45 deg 30 sec x 4;  Neuromuscular Re-education: Manual Therapy:   PROM GHJ, all motions      03/25/21: Therapeutic Exercise: Aerobic: Supine:  shoulder flexion AAROM/cane x 15;  ER/cane x 15 at 45 and 90 deg ; Shoulder ER butterfly 5sec x 10;      Seated: Pulley flex and scaption x 2 min ea;  Standing:    AROM flexion and abduction/full range x 15 each;  Rows GTB x 20;  IR/ER RTB x 15 each;  Stretches:    IR behind the back with stick, extension, to the side, and IR;    Neuromuscular Re-education: Manual Therapy:   PROM GHJ, all motions .  Therapeutic Activity: Self  Care:   03/21/21: Therapeutic Exercise: Aerobic: Supine:  shoulder flexion AAROM/cane x 15;  ER/cane x 15; Shoulder ER butterfly 5sec x 10;   ER AROM at 75 deg x 15;   Seated: Pulley flex and scaption x 2 min ea;  Standing:    AROM flexion and abduction/full range x 15 each;  Stretches:   ER stretch seated with arm on table 10 sec x 5;  IR behind the back with stick, extension, to the side, and IR;  Doorway pec stretch 45 deg 20 sec x 5;   Neuromuscular Re-education: Manual Therapy:   PROM GHJ, all motions . MWM for behind the back IR in standing with strap . Therapeutic Activity: Self Care:    PATIENT EDUCATION: Education details: updated and reviewed HEP Person educated: Patient Education method: Explanation, Demonstration, Tactile cues, Verbal cues, and Handouts Education comprehension: verbalized understanding, returned demonstration, verbal cues required, tactile cues required, and needs further education   HOME EXERCISE PROGRAM: Access Code: [email or web address]    ASSESSMENT:  CLINICAL IMPRESSION: 03/29/21: Pt able to perform strengthening without pain today. Pt making good improvements with ROM/PROM. Still limited at end range for all motions, but doing much better each week. Discussed focus for end range flexion, IR and ER for HEP this week.   Objective impairments include decreased activity tolerance, decreased knowledge of use of DME, decreased mobility, decreased ROM, decreased strength, hypomobility,  increased muscle spasms, impaired UE functional use, and pain. These impairments are limiting patient from cleaning, community activity, driving, meal prep, occupation, laundry, yard work, and shopping. Personal factors including  None  are also affecting patient's functional outcome. Patient will benefit from skilled PT to address above impairments and improve overall function.  REHAB POTENTIAL: Good  CLINICAL DECISION MAKING: Stable/uncomplicated  EVALUATION COMPLEXITY:  Low   GOALS: Goals reviewed with patient? Yes  SHORT TERM GOALS:  STG Name Target Date Goal status  1 Patient will be independent with initial HEP   03/27/21 MET  2 Pt to demo improved AROM of shoulder flexion to be improved by at least 10 degrees   03/27/21 MET  3  Pt to demo improved ER AROM by at least 8 degrees  03/27/21 MET   LONG TERM GOALS:   LTG Name Target Date Goal status  1 Patient will be independent with final HEP  05/08/21 INITIAL  2 Pt to report decreased pain in shoulder to 0-2/10 with reaching, lifting, carrying to improve ability for IADLs.   05/08/21 INITIAL  3 Pt to demonstrate improved strength of shoulder to be at least 4+/5, to improve ability for lift, carry and overhead activity.   05/08/21 INITIAL  4  Pt to demo improved AROM in L shoulder, to be WNL, to improve ability for ADLS.  05/08/21 INITIAL  5        PLAN: PT FREQUENCY: 2x/week  PT DURATION: 8 weeks  PLANNED INTERVENTIONS: Therapeutic exercises, Therapeutic activity, Neuro Muscular re-education, Patient/Family education, Joint mobilization, Dry Needling, Electrical stimulation, Cryotherapy, Moist heat, Taping, Traction, Ultrasound, Ionotophoresis 24m/ml Dexamethasone, and Manual therapy  PLAN FOR NEXT SESSION: Progress AAROM in all directions, manual, joint mobs, PROM for improving ROM>   LLyndee Hensen PT, DPT 10:12 AM  03/29/21

## 2021-04-01 ENCOUNTER — Other Ambulatory Visit: Payer: Self-pay

## 2021-04-01 ENCOUNTER — Ambulatory Visit (INDEPENDENT_AMBULATORY_CARE_PROVIDER_SITE_OTHER): Payer: 59 | Admitting: Physical Therapy

## 2021-04-01 ENCOUNTER — Encounter: Payer: Self-pay | Admitting: Physical Therapy

## 2021-04-01 DIAGNOSIS — M25612 Stiffness of left shoulder, not elsewhere classified: Secondary | ICD-10-CM

## 2021-04-01 DIAGNOSIS — M25512 Pain in left shoulder: Secondary | ICD-10-CM | POA: Diagnosis not present

## 2021-04-01 NOTE — Therapy (Signed)
OUTPATIENT PHYSICAL THERAPY TREATMENT    Patient Name: Deborah Martin MRN: [id number] DOB:1965/11/11, 56 y.o., female Today's Date: 04/01/2021   PT End of Session - 04/01/21 0804     Visit Number 6    Number of Visits 16    Date for PT Re-Evaluation 05/08/21    Authorization Type [facility]- [employer]    PT Start Time 0800    PT Stop Time 0838    PT Time Calculation (min) 38 min    Activity Tolerance Patient tolerated treatment well    Behavior During Therapy WFL for tasks assessed/performed               Past Medical History:  Diagnosis Date   Anxiety    Arthritis    lower back   Restless legs    Past Surgical History:  Procedure Laterality Date   CESAREAN SECTION     LAPAROSCOPIC VAGINAL HYSTERECTOMY WITH SALPINGO OOPHORECTOMY Bilateral 03/06/2016   Procedure: LAPAROSCOPIC ASSISTED VAGINAL HYSTERECTOMY WITH BILATERAL SALPINGO OOPHORECTOMY;  Surgeon: Everlene Farrier, MD;  Location: [facility];  Service: Gynecology;  Laterality: Bilateral;   Patient Active Problem List   Diagnosis Date Noted   Family history of ovarian cancer 03/06/2016    PCP: Darcus Austin, MD (Inactive)  REFERRING PROVIDER:Evan Lonna Cobb DIAG: L shoulder pain , adhesive capsulitis  THERAPY DIAG:  Acute pain of left shoulder  Stiffness of left shoulder, not elsewhere classified   ONSET DATE: 5 weeks ago  SUBJECTIVE:                                                                                                                                                                                      SUBJECTIVE STATEMENT: Pt states mild soreness today and last couple days.   PERTINENT HISTORY: None   PAIN:  Are you having pain? Yes NPRS scale: 0/10 overall, up to 7/10 with end range stretching  Pain location: Shoulder  Pain orientation: Left  PAIN TYPE: aching Pain description: intermittent  Aggravating factors: lifting, elevation, abd, behind back motions, sleeping.  Relieving factors:  Rest, heat,   PRECAUTIONS: None    PLOF: Independent  PATIENT GOALS  Decreased pain, improved use of arm, return to working out.   OBJECTIVE:   DIAGNOSTIC FINDINGS:  X-ray, neg     UPPER EXTREMITY AROM/PROM:  A/PROM Right 03/13/2021 Left 03/13/2021 03/18/21 LEFT 03/25/21 LEFT  Shoulder flexion  125/145 /150   Shoulder extension      Shoulder abduction  70/130    Shoulder adduction      Shoulder internal rotation  50/50 /55   Shoulder external rotation  48/48 /50 /61  Elbow flexion  Elbow extension            Neck ROM  WNL                            (Blank rows = not tested)  UPPER EXTREMITY MMT:  MMT Right 03/13/2021 Left 03/13/2021  Shoulder flexion  4- (at mid range)  Shoulder extension    Shoulder abduction  4- (at mid range)   Shoulder adduction    IR  4  ER  4                                  Grip strength (lbs)    (Blank rows = not tested)    TODAY'S TREATMENT:  04/01/21: Therapeutic Exercise: Aerobic: UBE 4 min/ L1 , fwd/bwd. Supine:  Flexion AROM x 15;  Seated: Pulley flex and scaption and abd x 2 min ea;  Standing:  Wall slides x 15;  AROM flexion and abd x 15  each;  Rows GTB x 20;  IR/ER RTB x 20 each;  Stretches: IR behind the back- extension, IR  ea x 15; Doorway stretch 45 deg 30 sec x 4;  ER 90/90 standing x10; Supine ER butterfly x10;  Neuromuscular Re-education: Manual Therapy:   PROM GHJ, all motions     03/29/21: Therapeutic Exercise: Aerobic: UBE 4 min/ L1 , fwd/bwd. Supine:  shoulder flexion AAROM/cane x 15;  ER 2lb at side x 15;  ER at 90 deg/ cane x 20;  Seated: Pulley flex and scaption and abd x 2 min ea;  Standing:    AROM flexion  2lb x 15 each;  Rows GTB x 20;  IR/ER RTB x 20 each;  Stretches: IR behind the back with stick, extension, ea x 15; Doorway stretch 45 deg 30 sec x 4;  Neuromuscular Re-education: Manual Therapy:   PROM GHJ, all motions      03/25/21: Therapeutic Exercise: Aerobic: Supine:   shoulder flexion AAROM/cane x 15;  ER/cane x 15 at 45 and 90 deg ; Shoulder ER butterfly 5sec x 10;      Seated: Pulley flex and scaption x 2 min ea;  Standing:    AROM flexion and abduction/full range x 15 each;  Rows GTB x 20;  IR/ER RTB x 15 each;  Stretches:    IR behind the back with stick, extension, to the side, and IR;    Neuromuscular Re-education: Manual Therapy:   PROM GHJ, all motions .  Therapeutic Activity: Self Care:   PATIENT EDUCATION: Education details: updated and reviewed HEP Person educated: Patient Education method: Explanation, Demonstration, Tactile cues, Verbal cues, and Handouts Education comprehension: verbalized understanding, returned demonstration, verbal cues required, tactile cues required, and needs further education   HOME EXERCISE PROGRAM: Access Code: [email or web address]    ASSESSMENT:  CLINICAL IMPRESSION: 04/01/21: Pt with slight increased soreness today with IR/ER ROM/PROM, but able to tolerate ther ex and manual well today. Discussed not over doing ROM for ER at 90/90 if making arm sore, and to do every other day. Pt doing very well overall, progressing well with ROM and strength. Will benefit from continued care to focus on regaining full/end range IR/ER.  Objective impairments include decreased activity tolerance, decreased knowledge of use of DME, decreased mobility, decreased ROM, decreased strength, hypomobility, increased muscle spasms, impaired UE functional use, and pain. These impairments are limiting patient from cleaning,  community activity, driving, meal prep, occupation, Medical sales representative, yard work, and shopping. Personal factors including  None  are also affecting patient's functional outcome. Patient will benefit from skilled PT to address above impairments and improve overall function.  REHAB POTENTIAL: Good  CLINICAL DECISION MAKING: Stable/uncomplicated  EVALUATION COMPLEXITY: Low   GOALS: Goals reviewed with patient? Yes  SHORT TERM  GOALS:  STG Name Target Date Goal status  1 Patient will be independent with initial HEP   03/27/21 MET  2 Pt to demo improved AROM of shoulder flexion to be improved by at least 10 degrees   03/27/21 MET  3  Pt to demo improved ER AROM by at least 8 degrees  03/27/21 MET   LONG TERM GOALS:   LTG Name Target Date Goal status  1 Patient will be independent with final HEP  05/08/21 INITIAL  2 Pt to report decreased pain in shoulder to 0-2/10 with reaching, lifting, carrying to improve ability for IADLs.   05/08/21 INITIAL  3 Pt to demonstrate improved strength of shoulder to be at least 4+/5, to improve ability for lift, carry and overhead activity.   05/08/21 INITIAL  4  Pt to demo improved AROM in L shoulder, to be WNL, to improve ability for ADLS.  05/08/21 INITIAL  5        PLAN: PT FREQUENCY: 2x/week  PT DURATION: 8 weeks  PLANNED INTERVENTIONS: Therapeutic exercises, Therapeutic activity, Neuro Muscular re-education, Patient/Family education, Joint mobilization, Dry Needling, Electrical stimulation, Cryotherapy, Moist heat, Taping, Traction, Ultrasound, Ionotophoresis 19m/ml Dexamethasone, and Manual therapy  PLAN FOR NEXT SESSION: Progress AAROM in all directions, manual, joint mobs, PROM for improving ROM>   LLyndee Hensen PT, DPT 8:42 AM  04/01/21

## 2021-04-03 ENCOUNTER — Encounter: Payer: 59 | Admitting: Physical Therapy

## 2021-04-04 ENCOUNTER — Encounter: Payer: Self-pay | Admitting: Physical Therapy

## 2021-04-04 ENCOUNTER — Ambulatory Visit (INDEPENDENT_AMBULATORY_CARE_PROVIDER_SITE_OTHER): Payer: 59 | Admitting: Physical Therapy

## 2021-04-04 ENCOUNTER — Other Ambulatory Visit: Payer: Self-pay

## 2021-04-04 DIAGNOSIS — M25512 Pain in left shoulder: Secondary | ICD-10-CM

## 2021-04-04 DIAGNOSIS — M25612 Stiffness of left shoulder, not elsewhere classified: Secondary | ICD-10-CM | POA: Diagnosis not present

## 2021-04-04 NOTE — Therapy (Signed)
OUTPATIENT PHYSICAL THERAPY TREATMENT    Patient Name: Deborah Martin MRN: [id number] DOB:10/22/1965, 56 y.o., female Today's Date: 04/04/2021   PT End of Session - 04/04/21 1537     Visit Number 7    Number of Visits 16    Date for PT Re-Evaluation 05/08/21    Authorization Type [facility]- [facility]    PT Start Time 1515    PT Stop Time 1550    PT Time Calculation (min) 35 min    Activity Tolerance Patient tolerated treatment well    Behavior During Therapy WFL for tasks assessed/performed                Past Medical History:  Diagnosis Date   Anxiety    Arthritis    lower back   Restless legs    Past Surgical History:  Procedure Laterality Date   CESAREAN SECTION     LAPAROSCOPIC VAGINAL HYSTERECTOMY WITH SALPINGO OOPHORECTOMY Bilateral 03/06/2016   Procedure: LAPAROSCOPIC ASSISTED VAGINAL HYSTERECTOMY WITH BILATERAL SALPINGO OOPHORECTOMY;  Surgeon: Everlene Farrier, MD;  Location: [facility];  Service: Gynecology;  Laterality: Bilateral;   Patient Active Problem List   Diagnosis Date Noted   Family history of ovarian cancer 03/06/2016    PCP: Darcus Austin, MD (Inactive)  REFERRING PROVIDER:Evan Lonna Cobb DIAG: L shoulder pain , adhesive capsulitis  THERAPY DIAG:  Acute pain of left shoulder  Stiffness of left shoulder, not elsewhere classified   ONSET DATE: 5 weeks ago  SUBJECTIVE:                                                                                                                                                                                      SUBJECTIVE STATEMENT: Pt states doing well, thinks she is improving.    PERTINENT HISTORY: None   PAIN:  Are you having pain? Yes NPRS scale: 0/10 overall, up to 5/10 with end range stretching  Pain location: Shoulder  Pain orientation: Left  PAIN TYPE: aching Pain description: intermittent  Aggravating factors: lifting, elevation, abd, behind back motions, sleeping.  Relieving factors: Rest,  heat,   PRECAUTIONS: None    PLOF: Independent  PATIENT GOALS  Decreased pain, improved use of arm, return to working out.   OBJECTIVE:   DIAGNOSTIC FINDINGS:  X-ray, neg     UPPER EXTREMITY AROM/PROM:  A/PROM Right 03/13/2021 Left 03/13/2021 03/18/21 LEFT 03/25/21 LEFT  Shoulder flexion  125/145 /150   Shoulder extension      Shoulder abduction  70/130    Shoulder adduction      Shoulder internal rotation  50/50 /55   Shoulder external rotation  48/48 /50 /61  Elbow  flexion      Elbow extension            Neck ROM  WNL                            (Blank rows = not tested)  UPPER EXTREMITY MMT:  MMT Right 03/13/2021 Left 03/13/2021  Shoulder flexion  4- (at mid range)  Shoulder extension    Shoulder abduction  4- (at mid range)   Shoulder adduction    IR  4  ER  4                                  Grip strength (lbs)    (Blank rows = not tested)    TODAY'S TREATMENT:  04/04/21: Therapeutic Exercise: Aerobic: UBE 4 min/ L1 , fwd/bwd. Supine:  90/90 ER with cane x 20;  ER at 45, 2 lb x 20;  Seated:  Standing:  Wall slides x 15 flexion;    IR/ER GTB x 20 each;  Stretches: IR behind the back- extension, IR  ea x 15; Doorway stretch 45 and 90 deg 30 sec x 4;   Neuromuscular Re-education: Manual Therapy:   PROM GHJ, all motions     04/01/21: Therapeutic Exercise: Aerobic: UBE 4 min/ L1 , fwd/bwd. Supine:  Flexion AROM x 15;  Seated: Pulley flex and scaption and abd x 2 min ea;  Standing:  Wall slides x 15;  AROM flexion and abd x 15  each;  Rows GTB x 20;  IR/ER RTB x 20 each;  Stretches: IR behind the back- extension, IR  ea x 15; Doorway stretch 45 deg 30 sec x 4;  ER 90/90 standing x10; Supine ER butterfly x10;  Neuromuscular Re-education: Manual Therapy:   PROM GHJ, all motions     03/29/21: Therapeutic Exercise: Aerobic: UBE 4 min/ L1 , fwd/bwd. Supine:  shoulder flexion AAROM/cane x 15;  ER 2lb at side x 15;  ER at 90 deg/ cane x 20;   Seated: Pulley flex and scaption and abd x 2 min ea;  Standing:    AROM flexion  2lb x 15 each;  Rows GTB x 20;  IR/ER RTB x 20 each;  Stretches: IR behind the back with stick, extension, ea x 15; Doorway stretch 45 deg 30 sec x 4;  Neuromuscular Re-education: Manual Therapy:   PROM GHJ, all motions     PATIENT EDUCATION: Education details: updated and reviewed HEP Person educated: Patient Education method: Explanation, Demonstration, Tactile cues, Verbal cues, and Handouts Education comprehension: verbalized understanding, returned demonstration, verbal cues required, tactile cues required, and needs further education   HOME EXERCISE PROGRAM: Access Code: [email or web address]    ASSESSMENT:  CLINICAL IMPRESSION: 04/04/21: Pt making very good progress with ROM. Today has most ROM for end range flexion, IR and ER that she has had, with less pain at end ranges. Discussed continued need for end range stretching for HEP. Pt to reduce to 1x/wk. Pt to benefit from continued focus on manual and regaining end range ROM.  Objective impairments include decreased activity tolerance, decreased knowledge of use of DME, decreased mobility, decreased ROM, decreased strength, hypomobility, increased muscle spasms, impaired UE functional use, and pain. These impairments are limiting patient from cleaning, community activity, driving, meal prep, occupation, laundry, yard work, and shopping. Personal factors including  None  are also affecting patient's functional  outcome. Patient will benefit from skilled PT to address above impairments and improve overall function.  REHAB POTENTIAL: Good  CLINICAL DECISION MAKING: Stable/uncomplicated  EVALUATION COMPLEXITY: Low   GOALS: Goals reviewed with patient? Yes  SHORT TERM GOALS:  STG Name Target Date Goal status  1 Patient will be independent with initial HEP   03/27/21 MET  2 Pt to demo improved AROM of shoulder flexion to be improved by at least 10 degrees    03/27/21 MET  3  Pt to demo improved ER AROM by at least 8 degrees  03/27/21 MET   LONG TERM GOALS:   LTG Name Target Date Goal status  1 Patient will be independent with final HEP  05/08/21 INITIAL  2 Pt to report decreased pain in shoulder to 0-2/10 with reaching, lifting, carrying to improve ability for IADLs.   05/08/21 INITIAL  3 Pt to demonstrate improved strength of shoulder to be at least 4+/5, to improve ability for lift, carry and overhead activity.   05/08/21 INITIAL  4  Pt to demo improved AROM in L shoulder, to be WNL, to improve ability for ADLS.  05/08/21 INITIAL  5        PLAN: PT FREQUENCY: 2x/week  PT DURATION: 8 weeks  PLANNED INTERVENTIONS: Therapeutic exercises, Therapeutic activity, Neuro Muscular re-education, Patient/Family education, Joint mobilization, Dry Needling, Electrical stimulation, Cryotherapy, Moist heat, Taping, Traction, Ultrasound, Ionotophoresis 37m/ml Dexamethasone, and Manual therapy  PLAN FOR NEXT SESSION: Progress AAROM in all directions, manual, joint mobs, PROM for improving ROM>   LLyndee Hensen PT, DPT 4:02 PM  04/04/21

## 2021-04-08 ENCOUNTER — Ambulatory Visit (INDEPENDENT_AMBULATORY_CARE_PROVIDER_SITE_OTHER): Payer: 59 | Admitting: Physical Therapy

## 2021-04-08 ENCOUNTER — Other Ambulatory Visit: Payer: Self-pay

## 2021-04-08 ENCOUNTER — Other Ambulatory Visit: Payer: Self-pay | Admitting: Surgery

## 2021-04-08 ENCOUNTER — Ambulatory Visit: Payer: Self-pay | Admitting: Surgery

## 2021-04-08 ENCOUNTER — Encounter: Payer: Self-pay | Admitting: Physical Therapy

## 2021-04-08 ENCOUNTER — Other Ambulatory Visit (HOSPITAL_COMMUNITY): Payer: Self-pay | Admitting: Surgery

## 2021-04-08 DIAGNOSIS — C4359 Malignant melanoma of other part of trunk: Secondary | ICD-10-CM | POA: Diagnosis not present

## 2021-04-08 DIAGNOSIS — M25512 Pain in left shoulder: Secondary | ICD-10-CM

## 2021-04-08 DIAGNOSIS — C439 Malignant melanoma of skin, unspecified: Secondary | ICD-10-CM

## 2021-04-08 DIAGNOSIS — M25612 Stiffness of left shoulder, not elsewhere classified: Secondary | ICD-10-CM | POA: Diagnosis not present

## 2021-04-08 NOTE — Therapy (Signed)
OUTPATIENT PHYSICAL THERAPY TREATMENT    Patient Name: Deborah Martin MRN: [id number] DOB:1965-06-20, 56 y.o., female Today's Date: 04/08/2021   PT End of Session - 04/08/21 1148     Visit Number 8    Number of Visits 16    Date for PT Re-Evaluation 05/08/21    Authorization Type [facility]- [facility]    PT Start Time 1102    PT Stop Time 1142    PT Time Calculation (min) 40 min    Activity Tolerance Patient tolerated treatment well    Behavior During Therapy WFL for tasks assessed/performed                 Past Medical History:  Diagnosis Date   Anxiety    Arthritis    lower back   Restless legs    Past Surgical History:  Procedure Laterality Date   CESAREAN SECTION     LAPAROSCOPIC VAGINAL HYSTERECTOMY WITH SALPINGO OOPHORECTOMY Bilateral 03/06/2016   Procedure: LAPAROSCOPIC ASSISTED VAGINAL HYSTERECTOMY WITH BILATERAL SALPINGO OOPHORECTOMY;  Surgeon: Everlene Farrier, MD;  Location: [facility];  Service: Gynecology;  Laterality: Bilateral;   Patient Active Problem List   Diagnosis Date Noted   Family history of ovarian cancer 03/06/2016    PCP: Darcus Austin, MD (Inactive)  REFERRING PROVIDER:Evan Lonna Cobb DIAG: L shoulder pain , adhesive capsulitis  THERAPY DIAG:  Acute pain of left shoulder  Stiffness of left shoulder, not elsewhere classified   ONSET DATE: 5 weeks ago  SUBJECTIVE:                                                                                                                                                                                      SUBJECTIVE STATEMENT: Pt states doing well,minimal pain.   PERTINENT HISTORY: None   PAIN:  Are you having pain? Yes NPRS scale: 0/10 overall, up to 5/10 with end range stretching  Pain location: Shoulder  Pain orientation: Left  PAIN TYPE: aching Pain description: intermittent  Aggravating factors: lifting, elevation, abd, behind back motions, sleeping.  Relieving factors: Rest, heat,    PRECAUTIONS: None    PLOF: Independent  PATIENT GOALS  Decreased pain, improved use of arm, return to working out.   OBJECTIVE:   DIAGNOSTIC FINDINGS:  X-ray, neg     UPPER EXTREMITY AROM/PROM:  A/PROM Right 03/13/2021 Left 03/13/2021 03/18/21 LEFT 03/25/21 LEFT  Shoulder flexion  125/145 /150   Shoulder extension      Shoulder abduction  70/130    Shoulder adduction      Shoulder internal rotation  50/50 /55   Shoulder external rotation  48/48 /50 /61  Elbow flexion  Elbow extension            Neck ROM  WNL                            (Blank rows = not tested)  UPPER EXTREMITY MMT:  MMT Right 03/13/2021 Left 03/13/2021 Left 04/08/21  Shoulder flexion  4- (at mid range) 4/5  Shoulder extension     Shoulder abduction  4- (at mid range)  4/5  Shoulder adduction     IR  4 4+/5  ER  4 4+/5                                          Grip strength (lbs)     (Blank rows = not tested)    TODAY'S TREATMENT:   04/08/21: Therapeutic Exercise: Aerobic: UBE 4 min/ L1 , fwd/bwd. Supine:  90/90 and 45 deg ER with cane x 20;   Seated:  Standing:  IR/ER GTB x 20 each; UE lift offs at wall 2 x 10; Flexion 2lb(full) x 20;  Abd (to 90 deg) x 10; overhead press 2lb x 10;  Stretches: IR behind the back- extension, IR  ea x 15; Doorway stretch 45 and 90 deg 30 sec x 4;    Neuromuscular Re-education: Manual Therapy:   PROM GHJ, all motions     04/04/21: Therapeutic Exercise: Aerobic: UBE 4 min/ L1 , fwd/bwd. Supine:  90/90 ER with cane x 20;  ER at 45, 2 lb x 20;  Seated:  Standing:  Wall slides x 15 flexion;    IR/ER GTB x 20 each;  Stretches: IR behind the back- extension, IR  ea x 15; Doorway stretch 45 and 90 deg 30 sec x 4;   Neuromuscular Re-education: Manual Therapy:   PROM GHJ, all motions     04/01/21: Therapeutic Exercise: Aerobic: UBE 4 min/ L1 , fwd/bwd. Supine:  Flexion AROM x 15;  Seated: Pulley flex and scaption and abd x 2 min ea;   Standing:  Wall slides x 15;  AROM flexion and abd x 15  each;  Rows GTB x 20;  IR/ER RTB x 20 each;  Stretches: IR behind the back- extension, IR  ea x 15; Doorway stretch 45 deg 30 sec x 4;  ER 90/90 standing x10; Supine ER butterfly x10;  Neuromuscular Re-education: Manual Therapy:   PROM GHJ, all motions      PATIENT EDUCATION: Education details: reviewed HEP Person educated: Patient Education method: Explanation, Demonstration, Tactile cues, Verbal cues, and Handouts Education comprehension: verbalized understanding, returned demonstration, verbal cues required, tactile cues required, and needs further education   HOME EXERCISE PROGRAM: Access Code: [email or web address]    ASSESSMENT:  CLINICAL IMPRESSION: 04/08/21: Pt showing good improvements. She has improving ROM at end range for flex and IR today. End range ER still mildly limited at 45 deg and 90 deg, with increased soreness in these ranges. She will benefit from continued care at 1x/wk for improving ROM .  Objective impairments include decreased activity tolerance, decreased knowledge of use of DME, decreased mobility, decreased ROM, decreased strength, hypomobility, increased muscle spasms, impaired UE functional use, and pain. These impairments are limiting patient from cleaning, community activity, driving, meal prep, occupation, laundry, yard work, and shopping. Personal factors including  None  are also affecting patient's functional outcome. Patient will benefit from  skilled PT to address above impairments and improve overall function.  REHAB POTENTIAL: Good  CLINICAL DECISION MAKING: Stable/uncomplicated  EVALUATION COMPLEXITY: Low   GOALS: Goals reviewed with patient? Yes  SHORT TERM GOALS:  STG Name Target Date Goal status  1 Patient will be independent with initial HEP   03/27/21 MET  2 Pt to demo improved AROM of shoulder flexion to be improved by at least 10 degrees   03/27/21 MET  3  Pt to demo improved ER AROM  by at least 8 degrees  03/27/21 MET   LONG TERM GOALS:   LTG Name Target Date Goal status  1 Patient will be independent with final HEP  05/08/21 INITIAL  2 Pt to report decreased pain in shoulder to 0-2/10 with reaching, lifting, carrying to improve ability for IADLs.   05/08/21 INITIAL  3 Pt to demonstrate improved strength of shoulder to be at least 4+/5, to improve ability for lift, carry and overhead activity.   05/08/21 INITIAL  4  Pt to demo improved AROM in L shoulder, to be WNL, to improve ability for ADLS.  05/08/21 INITIAL  5        PLAN: PT FREQUENCY: 2x/week  PT DURATION: 8 weeks  PLANNED INTERVENTIONS: Therapeutic exercises, Therapeutic activity, Neuro Muscular re-education, Patient/Family education, Joint mobilization, Dry Needling, Electrical stimulation, Cryotherapy, Moist heat, Taping, Traction, Ultrasound, Ionotophoresis 38m/ml Dexamethasone, and Manual therapy  PLAN FOR NEXT SESSION:   LLyndee Hensen PT, DPT 11:48 AM  04/08/21

## 2021-04-08 NOTE — H&P (View-Only) (Signed)
History of Present Illness: HPI: Deborah Martin is a 56 y.o. Female who was referred for evaluation of a newly-diagnosed melanoma. She first noticed a lesion on the right buttock 3-4 months ago that she became concerned about because it failed to resolve. She saw her dermatologist for a full skin exam and had a biopsy on 03/27/21. This showed a malignant melanoma with the following characteristics:   Depth: 1.3 mm Ulcerations: no Mitoses: <1 per mm2 Lymphovascular invasion: Absent Deep margin involved, peripheral margin not involved Pathologic stage: At least pT2a   She has no prior history of skin cancers. Her brother had melanoma on the face that she reports metastasized to the lung and brain.     Review of Systems: A complete review of systems was obtained from the patient.  I have reviewed this information and discussed as appropriate with the patient.  See HPI as well for other ROS.     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     Family History Family History Problem Relation Age of Onset  Melanoma Brother        Social History   Tobacco Use Smoking Status Never Smokeless Tobacco Never     Social History Social History    Socioeconomic History  Marital status: Divorced Tobacco  Use  Smoking status: Never  Smokeless tobacco: Never Substance and Sexual Activity  Alcohol use: Yes     Comment: occasonial  Drug use: Never      Objective:     Vitals:   04/08/21 1401 BP: 118/68 Pulse: 84 Temp: 36.7 C (98 F) SpO2: 97% Weight: 88.8 kg (195 lb 12.8 oz) Height: 162.6 cm (5\' 4" )   Body mass index is 33.61 kg/m.   Physical Exam Vitals reviewed.  Constitutional:      General: She is not in acute distress.    Appearance: Normal appearance.  Eyes:     General: No scleral icterus.    Conjunctiva/sclera: Conjunctivae normal.  Cardiovascular:     Rate and Rhythm: Normal rate and regular rhythm.     Heart sounds: No murmur heard. Pulmonary:     Effort: Pulmonary effort is normal. No respiratory distress.     Breath sounds: Normal breath sounds. No wheezing.  Musculoskeletal:        General: No swelling or deformity. Normal range of motion.     Cervical back: Normal range of motion.  Lymphadenopathy:     Comments: No palpable cervical, supraclavicular, axillary, or inguinal lymphadenopathy.  Skin:    General: Skin is warm and dry.     Comments: Biopsy site on right buttock is clean and dry, no pigmentation.  Neurological:     General:  No focal deficit present.     Mental Status: She is alert and oriented to person, place, and time.  Psychiatric:        Mood and Affect: Mood normal.        Behavior: Behavior normal.        Thought Content: Thought content normal.            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 She has no clinically palpable adenopathy on exam today. I discussed that the true depth is not really known at this point, and the depth and lymph node status will determine  staging.  Wide local excision with sentinel lymph node biopsy was recommended. The details of the procedure were discussed with the patient and informed consent was obtained. The patient agrees to proceed with surgery. The most likely nodal basin draining the lesion is the right groin, however we will obtain lymphoscintigraphy prior to surgery to confirm this. She will be contacted to schedule a date for her nuclear medicine study and surgery (to be scheduled either later this week or next week).  Michaelle Birks, [facility], Hepatobiliary and Pancreatic Surgery 04/08/21 3:49 PM

## 2021-04-08 NOTE — H&P (Signed)
History of Present Illness: HPI: Deborah Martin is a 56 y.o. Female who was referred for evaluation of a newly-diagnosed melanoma. She first noticed a lesion on the right buttock 3-4 months ago that she became concerned about because it failed to resolve. She saw her dermatologist for a full skin exam and had a biopsy on 03/27/21. This showed a malignant melanoma with the following characteristics:   Depth: 1.3 mm Ulcerations: no Mitoses: <1 per mm2 Lymphovascular invasion: Absent Deep margin involved, peripheral margin not involved Pathologic stage: At least pT2a   She has no prior history of skin cancers. Her brother had melanoma on the face that she reports metastasized to the lung and brain.     Review of Systems: A complete review of systems was obtained from the patient.  I have reviewed this information and discussed as appropriate with the patient.  See HPI as well for other ROS.     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     Family History Family History Problem Relation Age of Onset  Melanoma Brother        Social History   Tobacco Use Smoking Status Never Smokeless Tobacco Never     Social History Social History    Socioeconomic History  Marital status: Divorced Tobacco  Use  Smoking status: Never  Smokeless tobacco: Never Substance and Sexual Activity  Alcohol use: Yes     Comment: occasonial  Drug use: Never      Objective:     Vitals:   04/08/21 1401 BP: 118/68 Pulse: 84 Temp: 36.7 C (98 F) SpO2: 97% Weight: 88.8 kg (195 lb 12.8 oz) Height: 162.6 cm (5\' 4" )   Body mass index is 33.61 kg/m.   Physical Exam Vitals reviewed.  Constitutional:      General: She is not in acute distress.    Appearance: Normal appearance.  Eyes:     General: No scleral icterus.    Conjunctiva/sclera: Conjunctivae normal.  Cardiovascular:     Rate and Rhythm: Normal rate and regular rhythm.     Heart sounds: No murmur heard. Pulmonary:     Effort: Pulmonary effort is normal. No respiratory distress.     Breath sounds: Normal breath sounds. No wheezing.  Musculoskeletal:        General: No swelling or deformity. Normal range of motion.     Cervical back: Normal range of motion.  Lymphadenopathy:     Comments: No palpable cervical, supraclavicular, axillary, or inguinal lymphadenopathy.  Skin:    General: Skin is warm and dry.     Comments: Biopsy site on right buttock is clean and dry, no pigmentation.  Neurological:     General:  No focal deficit present.     Mental Status: She is alert and oriented to person, place, and time.  Psychiatric:        Mood and Affect: Mood normal.        Behavior: Behavior normal.        Thought Content: Thought content normal.            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 She has no clinically palpable adenopathy on exam today. I discussed that the true depth is not really known at this point, and the depth and lymph node status will determine  staging.  Wide local excision with sentinel lymph node biopsy was recommended. The details of the procedure were discussed with the patient and informed consent was obtained. The patient agrees to proceed with surgery. The most likely nodal basin draining the lesion is the right groin, however we will obtain lymphoscintigraphy prior to surgery to confirm this. She will be contacted to schedule a date for her nuclear medicine study and surgery (to be scheduled either later this week or next week).  Michaelle Birks, [facility], Hepatobiliary and Pancreatic Surgery 04/08/21 3:49 PM

## 2021-04-09 ENCOUNTER — Other Ambulatory Visit (HOSPITAL_COMMUNITY): Payer: Self-pay

## 2021-04-10 ENCOUNTER — Encounter: Payer: 59 | Admitting: Physical Therapy

## 2021-04-10 NOTE — Progress Notes (Signed)
Surgical Instructions   Your procedure is scheduled on Monday 04/15/2021.  Report to [facility] Main Entrance "A" at 09:15 A.M., then check in with the Admitting office.  Call [phone] if you have problems or questions between now and the morning of surgery:   Remember: Do not eat after midnight the night before your surgery  You may drink clear liquids until 08:15 a.m. the morning of your surgery.   Clear liquids allowed are: Water, Non-Citrus Juices (without pulp), Carbonated Beverages, Clear Tea, Black Coffee Only (NO MILK, CREAM, or POWDERED CREAMER of any kind), and Gatorade   Take these medicines the morning of surgery with A SIP OF WATER:  Escitalopram (Lexapro)   If needed you may take these medications the morning of surgery: Buspirone (Buspar) Cetirizine (Zyrtec)   As of today, STOP taking any Aspirin (unless otherwise instructed by your surgeon) or Aspirin-containing products; NSAIDS - Aleve, Naproxen, Ibuprofen, Motrin, Advil, Goody's, BC's, all herbal medications, fish oil, and all vitamins.   3 days leading up to your surgery  You are not required to quarantine however you are required to wear a well-fitting mask when you are out and around people not in your household.  If your mask becomes wet or soiled, replace with a new one.  Wash your hands often with soap and water for 20 seconds or clean your hands with an alcohol-based hand sanitizer that contains at least 60% alcohol.  Do not share personal items.  Notify your provider: if you are in close contact with someone who has COVID  or if you develop a fever of 100.4 or greater, sneezing, cough, sore throat, shortness of breath or body aches.          Do not wear jewelry or makeup  Do not wear lotions, powders, perfumes/colognes, or deodorant.  Do not shave 48 hours prior to surgery.  Men may shave face and neck.  Do not wear nail polish, gel polish, artificial nails, or any other type of covering on  natural nails including fingernails and toenails. If patients have artificial nails, gel coating, etc. that need to be removed by a nail salon please have this removed prior to surgery or surgery may need to be canceled/delayed if the surgeon/ anesthesia feels like the patient is unable to be adequately monitored.  Do not bring valuables to the hospital - [facility] is not responsible for any belongings or valuables.  Do NOT Smoke (Tobacco/Vaping) or drink Alcohol 24 hours prior to your procedure  If you use a CPAP at night, please bring your mask for your overnight stay.   Contacts, glasses, hearing aids, dentures or partials may not be worn into surgery, please bring cases for these belongings   For patients admitted to the hospital, discharge time will be determined by your treatment team.   Patients discharged the day of surgery will not be allowed to drive home, and someone needs to stay with them for 24 hours.  NO VISITORS WILL BE ALLOWED IN PRE-OP WHERE PATIENTS ARE PREPPED FOR SURGERY.  ONLY 1 SUPPORT PERSON MAY BE PRESENT IN THE WAITING ROOM WHILE YOU ARE IN SURGERY.  IF YOU ARE TO BE ADMITTED, ONCE YOU ARE IN YOUR ROOM YOU WILL BE ALLOWED TWO (2) VISITORS. 1 (ONE) VISITOR MAY STAY OVERNIGHT BUT MUST ARRIVE TO THE ROOM BY 8pm.  Minor children may have two parents present. Special consideration for safety and communication needs will be reviewed on a case by case basis.  Special  instructions:    Oral Hygiene is also important to reduce your risk of infection.  Remember - BRUSH YOUR TEETH THE MORNING OF SURGERY WITH YOUR REGULAR TOOTHPASTE   [place]- Preparing For Surgery  Before surgery, you can play an important role. Because skin is not sterile, your skin needs to be as free of germs as possible. You can reduce the number of germs on your skin by washing with CHG (chlorahexidine gluconate) Soap before surgery.  CHG is an antiseptic cleaner which kills germs and bonds with the  skin to continue killing germs even after washing.     Please do not use if you have an allergy to CHG or antibacterial soaps. If your skin becomes reddened/irritated stop using the CHG.  Do not shave (including legs and underarms) for at least 48 hours prior to first CHG shower. It is OK to shave your face.  Please follow these instructions carefully.     Shower the NIGHT BEFORE SURGERY and the MORNING OF SURGERY with CHG Soap.   If you chose to wash your hair, wash your hair first as usual with your normal shampoo. After you shampoo, rinse your hair and body thoroughly to remove the shampoo.    Then [employer] and genitals (private parts) with your normal soap and rinse thoroughly to remove soap.  Next use the CHG Soap as you would any other liquid soap. You can apply CHG directly to the skin and wash gently with a clean washcloth.   Apply the CHG Soap to your body ONLY FROM THE NECK DOWN.  Do not use on open wounds or open sores. Avoid contact with your eyes, ears, mouth and genitals (private parts). Wash Face and genitals (private parts)  with your normal soap.   Wash thoroughly, paying special attention to the area where your surgery will be performed.  Thoroughly rinse your body with warm water from the neck down.  DO NOT shower/wash with your normal soap after using and rinsing off the CHG Soap.  Pat yourself dry with a CLEAN TOWEL.  Wear CLEAN PAJAMAS to bed the night before surgery  Place CLEAN SHEETS on your bed the night before your surgery  DO NOT SLEEP WITH PETS.   Day of Surgery:  Take a shower with CHG soap. Wear Clean/Comfortable clothing the morning of surgery Do not apply any deodorants/lotions.   Remember to brush your teeth WITH YOUR REGULAR TOOTHPASTE.   Please read over the fact sheets that you were given.

## 2021-04-11 ENCOUNTER — Ambulatory Visit (HOSPITAL_COMMUNITY)
Admission: RE | Admit: 2021-04-11 | Discharge: 2021-04-11 | Disposition: A | Discharge status: Home / Self Care | Payer: 59 | Source: Ambulatory Visit | Attending: Surgery | Admitting: Surgery

## 2021-04-11 ENCOUNTER — Encounter (HOSPITAL_COMMUNITY)
Admission: RE | Admit: 2021-04-11 | Discharge: 2021-04-11 | Disposition: A | Discharge status: Home / Self Care | Payer: 59 | Source: Ambulatory Visit | Attending: Surgery | Admitting: Surgery

## 2021-04-11 ENCOUNTER — Other Ambulatory Visit: Payer: Self-pay

## 2021-04-11 ENCOUNTER — Encounter (HOSPITAL_COMMUNITY): Payer: Self-pay | Admitting: *Deleted

## 2021-04-11 VITALS — BP 123/77 | HR 70 | Temp 98.8°F | Resp 17 | Ht 63.0 in | Wt 195.0 lb

## 2021-04-11 DIAGNOSIS — Z01818 Encounter for other preprocedural examination: Secondary | ICD-10-CM

## 2021-04-11 DIAGNOSIS — C4359 Malignant melanoma of other part of trunk: Secondary | ICD-10-CM | POA: Insufficient documentation

## 2021-04-11 HISTORY — DX: Malignant (primary) neoplasm, unspecified: C80.1

## 2021-04-11 LAB — CBC
HCT: 41.8 % (ref 36.0–46.0)
Hemoglobin: 14 g/dL (ref 12.0–15.0)
MCH: 30.1 pg (ref 26.0–34.0)
MCHC: 33.5 g/dL (ref 30.0–36.0)
MCV: 89.9 fL (ref 80.0–100.0)
Platelets: 285 10*3/uL (ref 150–400)
RBC: 4.65 MIL/uL (ref 3.87–5.11)
RDW: 12.6 % (ref 11.5–15.5)
WBC: 8.6 10*3/uL (ref 4.0–10.5)
nRBC: 0 % (ref 0.0–0.2)

## 2021-04-11 MED ORDER — TECHNETIUM TC 99M TILMANOCEPT KIT
0.5000 | PACK | Freq: Once | INTRAVENOUS | Status: AC | PRN
  Administered 2021-04-11: 0.5 via INTRADERMAL

## 2021-04-11 NOTE — Progress Notes (Signed)
PCP - Larae Grooms Cardiologist - none  PPM/ICD - denies Device Orders -  Rep Notified -   Chest x-ray - none EKG - none Stress Test - none ECHO - none Cardiac Cath - none  Sleep Study - none CPAP -   Fasting Blood Sugar -n/a  Checks Blood Sugar _____ times a day  Blood Thinner Instructions:n/a Aspirin Instructions:  ERAS Protcol -yes,clear liquids until 0815  PRE-SURGERY Ensure or G2- no  COVID TEST- no-ambulatory surgery   Anesthesia review: no  Patient denies shortness of breath, fever, cough and chest pain at PAT appointment   All instructions explained to the patient, with a verbal understanding of the material. Patient agrees to go over the instructions while at home for a better understanding. Patient also instructed to wear a mask when out in public.  The opportunity to ask questions was provided.

## 2021-04-13 ENCOUNTER — Ambulatory Visit (HOSPITAL_COMMUNITY): Payer: 59

## 2021-04-15 ENCOUNTER — Encounter (HOSPITAL_COMMUNITY)
Admission: RE | Disposition: A | Discharge status: Home / Self Care | Payer: Self-pay | Source: Home / Self Care | Attending: Surgery

## 2021-04-15 ENCOUNTER — Ambulatory Visit (HOSPITAL_COMMUNITY)
Admission: RE | Admit: 2021-04-15 | Discharge: 2021-04-15 | Disposition: A | Discharge status: Home / Self Care | Payer: 59 | Source: Ambulatory Visit | Attending: Surgery | Admitting: Surgery

## 2021-04-15 ENCOUNTER — Ambulatory Visit (HOSPITAL_BASED_OUTPATIENT_CLINIC_OR_DEPARTMENT_OTHER): Payer: 59 | Admitting: Anesthesiology

## 2021-04-15 ENCOUNTER — Ambulatory Visit (HOSPITAL_COMMUNITY)
Admission: RE | Admit: 2021-04-15 | Discharge: 2021-04-15 | Disposition: A | Discharge status: Home / Self Care | Payer: 59 | Attending: Surgery | Admitting: Surgery

## 2021-04-15 ENCOUNTER — Other Ambulatory Visit: Payer: Self-pay

## 2021-04-15 ENCOUNTER — Other Ambulatory Visit (HOSPITAL_COMMUNITY): Payer: Self-pay

## 2021-04-15 ENCOUNTER — Ambulatory Visit (HOSPITAL_COMMUNITY): Payer: 59 | Admitting: Anesthesiology

## 2021-04-15 ENCOUNTER — Encounter (HOSPITAL_COMMUNITY): Payer: Self-pay | Admitting: Surgery

## 2021-04-15 DIAGNOSIS — C4359 Malignant melanoma of other part of trunk: Secondary | ICD-10-CM | POA: Insufficient documentation

## 2021-04-15 DIAGNOSIS — C439 Malignant melanoma of skin, unspecified: Secondary | ICD-10-CM

## 2021-04-15 DIAGNOSIS — Z6833 Body mass index (BMI) 33.0-33.9, adult: Secondary | ICD-10-CM | POA: Insufficient documentation

## 2021-04-15 DIAGNOSIS — F419 Anxiety disorder, unspecified: Secondary | ICD-10-CM | POA: Insufficient documentation

## 2021-04-15 HISTORY — PX: EXCISION MELANOMA WITH SENTINEL LYMPH NODE BIOPSY: SHX5628

## 2021-04-15 SURGERY — EXCISION, MELANOMA, WITH SENTINEL LYMPH NODE BIOPSY
Anesthesia: General | Site: Buttocks | Laterality: Right

## 2021-04-15 MED ORDER — SUGAMMADEX SODIUM 200 MG/2ML IV SOLN
INTRAVENOUS | Status: DC | PRN
  Administered 2021-04-15: 200 mg via INTRAVENOUS

## 2021-04-15 MED ORDER — BUPIVACAINE-EPINEPHRINE (PF) 0.25% -1:200000 IJ SOLN
INTRAMUSCULAR | Status: DC | PRN
  Administered 2021-04-15: 16 mL

## 2021-04-15 MED ORDER — LIDOCAINE 2% (20 MG/ML) 5 ML SYRINGE
INTRAMUSCULAR | Status: DC | PRN
Start: 2021-04-15 — End: 2021-04-15
  Administered 2021-04-15: 100 mg via INTRAVENOUS

## 2021-04-15 MED ORDER — ACETAMINOPHEN 500 MG PO TABS
1000.0000 mg | ORAL_TABLET | ORAL | Status: AC
  Administered 2021-04-15: 1000 mg via ORAL
  Filled 2021-04-15: qty 2

## 2021-04-15 MED ORDER — OXYCODONE HCL 5 MG/5ML PO SOLN: 5.0000 mg | Freq: Once | ORAL | Status: AC | PRN

## 2021-04-15 MED ORDER — KETOROLAC TROMETHAMINE 30 MG/ML IJ SOLN
INTRAMUSCULAR | Status: AC
  Filled 2021-04-15: qty 1

## 2021-04-15 MED ORDER — CEFAZOLIN SODIUM-DEXTROSE 2-4 GM/100ML-% IV SOLN
2.0000 g | INTRAVENOUS | Status: AC
  Administered 2021-04-15: 2 g via INTRAVENOUS
  Filled 2021-04-15: qty 100

## 2021-04-15 MED ORDER — PROPOFOL 10 MG/ML IV BOLUS
INTRAVENOUS | Status: AC
  Filled 2021-04-15: qty 20

## 2021-04-15 MED ORDER — HYDROCODONE-ACETAMINOPHEN 5-325 MG PO TABS
1.0000 | ORAL_TABLET | Freq: Four times a day (QID) | ORAL | 0 refills | Status: DC | PRN
  Filled 2021-04-15: qty 20, 5d supply, fill #0

## 2021-04-15 MED ORDER — ROCURONIUM BROMIDE 10 MG/ML (PF) SYRINGE
PREFILLED_SYRINGE | INTRAVENOUS | Status: DC | PRN
Start: 2021-04-15 — End: 2021-04-15
  Administered 2021-04-15 (×2): 20 mg via INTRAVENOUS
  Administered 2021-04-15: 40 mg via INTRAVENOUS

## 2021-04-15 MED ORDER — CHLORHEXIDINE GLUCONATE 0.12 % MT SOLN
15.0000 mL | Freq: Once | OROMUCOSAL | Status: AC
  Administered 2021-04-15: 15 mL via OROMUCOSAL
  Filled 2021-04-15: qty 15

## 2021-04-15 MED ORDER — METHYLENE BLUE 0.5 % INJ SOLN
INTRAVENOUS | Status: AC
  Filled 2021-04-15: qty 10

## 2021-04-15 MED ORDER — ACETAMINOPHEN 325 MG PO TABS: 325.0000 mg | ORAL_TABLET | ORAL | Status: DC | PRN

## 2021-04-15 MED ORDER — ACETAMINOPHEN 160 MG/5ML PO SOLN: 325.0000 mg | ORAL | Status: DC | PRN

## 2021-04-15 MED ORDER — LACTATED RINGERS IV SOLN: INTRAVENOUS | Status: DC

## 2021-04-15 MED ORDER — FENTANYL CITRATE (PF) 100 MCG/2ML IJ SOLN
25.0000 ug | INTRAMUSCULAR | Status: DC | PRN
  Administered 2021-04-15 (×2): 50 ug via INTRAVENOUS

## 2021-04-15 MED ORDER — ORAL CARE MOUTH RINSE: 15.0000 mL | Freq: Once | OROMUCOSAL | Status: AC

## 2021-04-15 MED ORDER — TECHNETIUM TC 99M TILMANOCEPT KIT
0.5000 | PACK | Freq: Once | INTRAVENOUS | Status: AC | PRN
  Administered 2021-04-15: 0.5 via INTRADERMAL

## 2021-04-15 MED ORDER — MIDAZOLAM HCL 2 MG/2ML IJ SOLN
INTRAMUSCULAR | Status: AC
  Filled 2021-04-15: qty 2

## 2021-04-15 MED ORDER — DEXAMETHASONE SODIUM PHOSPHATE 10 MG/ML IJ SOLN
INTRAMUSCULAR | Status: AC
  Filled 2021-04-15: qty 2

## 2021-04-15 MED ORDER — FENTANYL CITRATE (PF) 250 MCG/5ML IJ SOLN
INTRAMUSCULAR | Status: AC
  Filled 2021-04-15: qty 5

## 2021-04-15 MED ORDER — METHYLENE BLUE 0.5 % INJ SOLN
INTRAVENOUS | Status: DC | PRN
  Administered 2021-04-15: 1 mL via INTRADERMAL

## 2021-04-15 MED ORDER — OXYCODONE HCL 5 MG PO TABS
5.0000 mg | ORAL_TABLET | Freq: Once | ORAL | Status: AC | PRN
  Administered 2021-04-15: 5 mg via ORAL

## 2021-04-15 MED ORDER — FENTANYL CITRATE (PF) 250 MCG/5ML IJ SOLN
INTRAMUSCULAR | Status: DC | PRN
  Administered 2021-04-15 (×4): 50 ug via INTRAVENOUS

## 2021-04-15 MED ORDER — ONDANSETRON HCL 4 MG/2ML IJ SOLN: 4.0000 mg | Freq: Once | INTRAMUSCULAR | Status: DC | PRN

## 2021-04-15 MED ORDER — GABAPENTIN 300 MG PO CAPS
300.0000 mg | ORAL_CAPSULE | ORAL | Status: AC
  Administered 2021-04-15: 300 mg via ORAL
  Filled 2021-04-15: qty 1

## 2021-04-15 MED ORDER — MEPERIDINE HCL 25 MG/ML IJ SOLN: 6.2500 mg | INTRAMUSCULAR | Status: DC | PRN

## 2021-04-15 MED ORDER — DEXAMETHASONE SODIUM PHOSPHATE 10 MG/ML IJ SOLN
INTRAMUSCULAR | Status: DC | PRN
  Administered 2021-04-15: 10 mg via INTRAVENOUS

## 2021-04-15 MED ORDER — MIDAZOLAM HCL 2 MG/2ML IJ SOLN
INTRAMUSCULAR | Status: DC | PRN
  Administered 2021-04-15: 2 mg via INTRAVENOUS

## 2021-04-15 MED ORDER — PROPOFOL 10 MG/ML IV BOLUS
INTRAVENOUS | Status: DC | PRN
Start: 2021-04-15 — End: 2021-04-15
  Administered 2021-04-15: 40 mg via INTRAVENOUS
  Administered 2021-04-15: 160 mg via INTRAVENOUS

## 2021-04-15 MED ORDER — ONDANSETRON HCL 4 MG/2ML IJ SOLN
INTRAMUSCULAR | Status: AC
  Filled 2021-04-15: qty 4

## 2021-04-15 MED ORDER — BUPIVACAINE-EPINEPHRINE (PF) 0.25% -1:200000 IJ SOLN
INTRAMUSCULAR | Status: AC
  Filled 2021-04-15: qty 30

## 2021-04-15 MED ORDER — FENTANYL CITRATE (PF) 100 MCG/2ML IJ SOLN
INTRAMUSCULAR | Status: AC
  Filled 2021-04-15: qty 2

## 2021-04-15 MED ORDER — EPHEDRINE SULFATE-NACL 50-0.9 MG/10ML-% IV SOSY
PREFILLED_SYRINGE | INTRAVENOUS | Status: DC | PRN
  Administered 2021-04-15 (×3): 5 mg via INTRAVENOUS

## 2021-04-15 MED ORDER — SODIUM CHLORIDE (PF) 0.9 % IJ SOLN
INTRAMUSCULAR | Status: AC
  Filled 2021-04-15: qty 10

## 2021-04-15 MED ORDER — ESMOLOL HCL 100 MG/10ML IV SOLN
INTRAVENOUS | Status: AC
  Filled 2021-04-15: qty 10

## 2021-04-15 MED ORDER — ONDANSETRON HCL 4 MG/2ML IJ SOLN
INTRAMUSCULAR | Status: DC | PRN
  Administered 2021-04-15: 4 mg via INTRAVENOUS

## 2021-04-15 MED ORDER — OXYCODONE HCL 5 MG PO TABS
ORAL_TABLET | ORAL | Status: AC
  Filled 2021-04-15: qty 1

## 2021-04-15 SURGICAL SUPPLY — 69 items
ADH SKN CLS APL DERMABOND .7 (GAUZE/BANDAGES/DRESSINGS) ×2
APL PRP STRL LF DISP 70% ISPRP (MISCELLANEOUS) ×2
BAG COUNTER SPONGE SURGICOUNT (BAG) ×2 IMPLANT
BAG SPNG CNTER NS LX DISP (BAG) ×1
BLADE SURG 10 STRL SS (BLADE) ×2 IMPLANT
BNDG COHESIVE 4X5 TAN STRL (GAUZE/BANDAGES/DRESSINGS) IMPLANT
BNDG GAUZE ELAST 4 BULKY (GAUZE/BANDAGES/DRESSINGS) IMPLANT
CANISTER SUCT 3000ML PPV (MISCELLANEOUS) ×2 IMPLANT
CHLORAPREP W/TINT 26 (MISCELLANEOUS) ×3 IMPLANT
CLIP VESOCCLUDE SM WIDE 24/CT (CLIP) ×1 IMPLANT
CNTNR URN SCR LID CUP LEK RST (MISCELLANEOUS) ×2 IMPLANT
CONT SPEC 4OZ STRL OR WHT (MISCELLANEOUS) ×4
COVER MAYO STAND STRL (DRAPES) IMPLANT
COVER PROBE W GEL 5X96 (DRAPES) ×2 IMPLANT
COVER SURGICAL LIGHT HANDLE (MISCELLANEOUS) ×2 IMPLANT
DECANTER SPIKE VIAL GLASS SM (MISCELLANEOUS) ×1 IMPLANT
DERMABOND ADVANCED (GAUZE/BANDAGES/DRESSINGS) ×2
DERMABOND ADVANCED .7 DNX12 (GAUZE/BANDAGES/DRESSINGS) IMPLANT
DRAPE HALF SHEET 40X57 (DRAPES) ×1 IMPLANT
DRAPE INCISE IOBAN 66X45 STRL (DRAPES) ×1 IMPLANT
DRAPE LAPAROSCOPIC ABDOMINAL (DRAPES) IMPLANT
DRAPE LAPAROTOMY 100X72 PEDS (DRAPES) ×3 IMPLANT
DRAPE UTILITY XL STRL (DRAPES) ×2 IMPLANT
DRSG TEGADERM 4X4.75 (GAUZE/BANDAGES/DRESSINGS) ×1 IMPLANT
DRSG TELFA 3X8 NADH (GAUZE/BANDAGES/DRESSINGS) ×2 IMPLANT
ELECT REM PT RETURN 9FT ADLT (ELECTROSURGICAL) ×2
ELECTRODE REM PT RTRN 9FT ADLT (ELECTROSURGICAL) ×1 IMPLANT
GAUZE SPONGE 2X2 8PLY STRL LF (GAUZE/BANDAGES/DRESSINGS) IMPLANT
GAUZE SPONGE 4X4 12PLY STRL (GAUZE/BANDAGES/DRESSINGS) ×1 IMPLANT
GLOVE SURG POLY MICRO LF SZ5.5 (GLOVE) ×4 IMPLANT
GLOVE SURG UNDER POLY LF SZ6 (GLOVE) ×4 IMPLANT
GLOVE SURG UNDER POLY LF SZ7 (GLOVE) ×1 IMPLANT
GOWN STRL REUS W/ TWL LRG LVL3 (GOWN DISPOSABLE) ×2 IMPLANT
GOWN STRL REUS W/TWL LRG LVL3 (GOWN DISPOSABLE) ×8
KIT BASIN OR (CUSTOM PROCEDURE TRAY) ×2 IMPLANT
KIT TURNOVER KIT B (KITS) ×2 IMPLANT
MARKER SKIN DUAL TIP RULER LAB (MISCELLANEOUS) ×1 IMPLANT
NDL 18GX1X1/2 (RX/OR ONLY) (NEEDLE) ×1 IMPLANT
NDL FILTER BLUNT 18X1 1/2 (NEEDLE) IMPLANT
NDL HYPO 25GX1X1/2 BEV (NEEDLE) ×2 IMPLANT
NEEDLE 18GX1X1/2 (RX/OR ONLY) (NEEDLE) IMPLANT
NEEDLE FILTER BLUNT 18X 1/2SAF (NEEDLE) ×1
NEEDLE FILTER BLUNT 18X1 1/2 (NEEDLE) ×1 IMPLANT
NEEDLE HYPO 25GX1X1/2 BEV (NEEDLE) ×4 IMPLANT
NS IRRIG 1000ML POUR BTL (IV SOLUTION) ×2 IMPLANT
PACK GENERAL/GYN (CUSTOM PROCEDURE TRAY) ×2 IMPLANT
PACK UNIVERSAL I (CUSTOM PROCEDURE TRAY) ×1 IMPLANT
PAD ARMBOARD 7.5X6 YLW CONV (MISCELLANEOUS) ×4 IMPLANT
PAD DRESSING TELFA 3X8 NADH (GAUZE/BANDAGES/DRESSINGS) ×1 IMPLANT
PENCIL SMOKE EVACUATOR (MISCELLANEOUS) ×1 IMPLANT
SPONGE GAUZE 2X2 STER 10/PKG (GAUZE/BANDAGES/DRESSINGS)
STAPLER SKIN 35 WIDE (STAPLE) ×1 IMPLANT
STOCKINETTE IMPERVIOUS 9X36 MD (GAUZE/BANDAGES/DRESSINGS) IMPLANT
STRIP CLOSURE SKIN 1/2X4 (GAUZE/BANDAGES/DRESSINGS) IMPLANT
SUT ETHILON 3 0 FSL (SUTURE) ×1 IMPLANT
SUT ETHILON 3 0 PS 1 (SUTURE) ×1 IMPLANT
SUT MNCRL AB 4-0 PS2 18 (SUTURE) ×3 IMPLANT
SUT SILK 2 0 PERMA HAND 18 BK (SUTURE) ×2 IMPLANT
SUT SILK 2 0 TIES 17X18 (SUTURE)
SUT SILK 2-0 18XBRD TIE BLK (SUTURE) ×1 IMPLANT
SUT SILK 3 0 SH 30 (SUTURE) ×2 IMPLANT
SUT VIC AB 2-0 SH 27 (SUTURE) ×2
SUT VIC AB 2-0 SH 27XBRD (SUTURE) IMPLANT
SUT VIC AB 3-0 SH 27 (SUTURE) ×2
SUT VIC AB 3-0 SH 27X BRD (SUTURE) IMPLANT
SUT VIC AB 3-0 SH 8-18 (SUTURE) ×2 IMPLANT
SYR CONTROL 10ML LL (SYRINGE) ×4 IMPLANT
TOWEL GREEN STERILE (TOWEL DISPOSABLE) ×1 IMPLANT
TOWEL GREEN STERILE FF (TOWEL DISPOSABLE) ×2 IMPLANT

## 2021-04-15 NOTE — Discharge Instructions (Addendum)
[facility] DISCHARGE INSTRUCTIONS  Activity Avoid activity that requires strenuous use of your right leg for the next 2-3 weeks. Ok to shower in 48 hours after surgery, but do not bathe or submerge incisions underwater. Do not drive while taking narcotic pain medication.  Wound Care You may remove the dressing over your buttock incision in 48 hours after surgery. It is then ok to shower over your incisions. Your incisions are covered with skin glue called Dermabond. This will peel off on its own over time. You may shower and allow warm soapy water to run over your incisions. Gently pat dry. Do not submerge your incision underwater. Monitor your incision for any new redness, tenderness, or drainage.  When to Call us: Fever greater than 100.5 New redness, drainage, or swelling at incision site Severe pain, nausea, or vomiting Jaundice (yellowing of the whites of the eyes or skin)  Follow-up You will have an appointment scheduled with Dr. Zenia Resides in 2 weeks, at which time your sutures will be removed. This will be at the [facility] office at [address] [place], [place], [place]. Please arrive at least 15 minutes prior to your scheduled appointment time.  For questions or concerns, please call the office at [phone].'

## 2021-04-15 NOTE — Anesthesia Postprocedure Evaluation (Signed)
Anesthesia Post Note  Patient: Deborah Martin  Procedure(s) Performed: WIDE LOCAL EXCISION OF RIGHT BUTTOCK MELANOMA WITH SENTINEL LYMPH NODE BIOPSY (Right: Buttocks)     Patient location during evaluation: PACU Anesthesia Type: General Level of consciousness: awake and alert Pain management: pain level controlled Vital Signs Assessment: post-procedure vital signs reviewed and stable Respiratory status: spontaneous breathing, nonlabored ventilation, respiratory function stable and patient connected to nasal cannula oxygen Cardiovascular status: blood pressure returned to baseline and stable Postop Assessment: no apparent nausea or vomiting Anesthetic complications: no   No notable events documented.  Last Vitals:  Vitals:   04/15/21 1410 04/15/21 1425  BP: (!) 120/55 121/63  Pulse: 72 72  Resp: 11 14  Temp:  36.8 C  SpO2: 95% 99%    Last Pain:  Vitals:   04/15/21 1425  TempSrc:   PainSc: 2                  Marvelyn Bouchillon

## 2021-04-15 NOTE — Op Note (Signed)
Date: 04/15/21  Patient: Deborah Martin MRN: [id number]  Preoperative Diagnosis: Melanoma of right buttock Postoperative Diagnosis: Same  Procedure: Right inguinal sentinel lymph node biopsy Wide local excision of right buttock melanoma with 2cm margins  Surgeon: Michaelle Birks, MD Assistant: Yvone Neu, MD (Resident)  EBL: Minimal  Anesthesia: General endotracheal  Specimens:  Right inguinal sentinel lymph node #1 Right inguinal lymph nodes (not sentinel nodes) Right buttock melanoma Right inguinal sentinel lymph node #2  Indications: Deborah Martin is a 56 yo female who presented with a lesion on the right buttock, and a shave biopsy showed a melanoma with a depth of 1.34mm, with positive deep margins. The patient agreed to proceed with wide local excision with a sentinel lymph node biopsy.  Findings: Two sentinel lymph nodes identified and excised from the right groin. Wide local excision with 2cm was performed of the primary tumor on the right buttock (total excised dimensions of 9cm x 5cm)  Procedure details: Informed consent was obtained in the preoperative area prior to the procedure. The patient was brought to the operating room and placed on the table in the supine position. General anesthesia was induced and appropriate lines and drains were placed for intraoperative monitoring. Perioperative antibiotics were administered per SCIP guidelines.  The primary lesion on the right buttock was injected with 1 cc methylene blue.  The right groin was prepped and draped in the usual sterile fashion. A pre-procedure timeout was taken verifying patient identity, surgical site and procedure to be performed.  A small vertical skin incision was made in the right groin over the inguinal crease.  The subcutaneous tissue was divided with cautery.  The neoprobe was used to identify a hot lymph node.  It was dissected out of the surrounding subcutaneous tissue using blunt dissection and cautery.   Small blood vessels and lymphatics were clipped prior to division.  The lymph node was removed and the ex vivo count was 76; it was sent to pathology as sentinel lymph node #1.  The wound was again probed and there was still significant radioactivity in the wound bed.  Methylene blue did not appear to have traveled to any of the lymph nodes.  Another lymph node was identified and dissected out and excised, but the ex vivo count was 0.  This was sent to pathology as an inguinal lymph node.  There was still significant radioactivity in the wound bed but there appeared to be a lot of background activity from the primary tumor.  There is also significant activity medially deep to the external oblique fascia and near the pubic bone.  On review of the previous lymphoscintigraphy this confirmed that there was radiotracer accumulation in the bladder, which was contributing significant background radioactivity.  The decision was made to place a sterile dressing over the inguinal incision and remove the primary tumor to minimize background activity, then explore the inguinal wound again to ensure all sentinel nodes had been completely removed.  The inguinal incision was loosely closed with staples and covered with an [place].  The patient was then placed in the left lateral decubitus position and the right buttock was prepped and draped.  2 cm margins were circumferentially marked out surrounding the primary lesion.  An elliptical skin incision was made to include these margins; the long axis of the ellipse was oriented along the natural lines of tension of the skin.  The subcutaneous tissue was then divided with cautery down to the muscle fascia.  The specimen was taken  off the fascia and excised.  The specimen was oriented and sent for routine pathology.  The wound was irrigated and hemostasis was achieved.  The wound was closed in layers with interrupted 3-0 Vicryl sutures.  The deep dermis was reapproximated with  interrupted 3-0 Vicryl, and the skin was closed with a running subcuticular 4 Monocryl suture.  The central part of the wound was reinforced with 3-0 nylon vertical mattress sutures to minimize tension on the wound.  Dermabond was applied, followed by a sterile dressing.  The patient was placed back in the supine position, the sterile [place] was removed, and the right groin was prepped and draped.  The staples were removed and the wound was reopened.  The neoprobe was used to explore the wound, and a hot area was identified in the lateral aspect of the wound.  A lymph node was identified and dissected out using blunt dissection and cautery.  Clips were placed on small blood vessels and lymphatics.  The lymph node was excised and the ex vivo count was close to 200.  The node was sent to pathology as sentinel lymph node #2.  The wound was again probed with the neoprobe and there was activity very medially near the pubic bone, attributed to radiotracer accumulation in the bladder, but there were no visible lymph nodes in this area.  The remainder of the wound had a very low count of less than 10.  The wound was irrigated and hemostasis was achieved with cautery.  The deep fascia was closed with a running 2-0 Vicryl suture.  The deep dermis was closed with a running 3-0 Vicryl suture, and the skin was closed with a running subcuticular 4 Monocryl suture.  Dermabond was applied.  The patient tolerated the procedure well with no apparent complications. All counts were correct x2 at the end of the procedure. The patient was extubated and taken to PACU in stable condition.  Michaelle Birks, MD 04/15/21 2:16 PM

## 2021-04-15 NOTE — Anesthesia Procedure Notes (Signed)
Procedure Name: Intubation Date/Time: 04/15/2021 11:07 AM Performed by: Inda Coke, CRNA Pre-anesthesia Checklist: Patient identified, Emergency Drugs available, Suction available and Patient being monitored Patient Re-evaluated:Patient Re-evaluated prior to induction Oxygen Delivery Method: Circle System Utilized Preoxygenation: Pre-oxygenation with 100% oxygen Induction Type: IV induction Ventilation: Mask ventilation without difficulty Laryngoscope Size: Mac and 3 Grade View: Grade I Tube type: Oral Tube size: 7.0 mm Number of attempts: 1 Airway Equipment and Method: Stylet Placement Confirmation: ETT inserted through vocal cords under direct vision, positive ETCO2 and breath sounds checked- equal and bilateral Secured at: 21 cm Tube secured with: Tape Dental Injury: Teeth and Oropharynx as per pre-operative assessment

## 2021-04-15 NOTE — Transfer of Care (Signed)
Immediate Anesthesia Transfer of Care Note  Patient: Deborah Martin  Procedure(s) Performed: WIDE LOCAL EXCISION OF RIGHT BUTTOCK MELANOMA WITH SENTINEL LYMPH NODE BIOPSY (Right: Buttocks)  Patient Location: PACU  Anesthesia Type:General  Level of Consciousness: awake and alert   Airway & Oxygen Therapy: Patient Spontanous Breathing and Patient connected to nasal cannula oxygen  Post-op Assessment: Report given to RN and Post -op Vital signs reviewed and stable  Post vital signs: Reviewed and stable  Last Vitals:  Vitals Value Taken Time  BP 97/57 04/15/21 1355  Temp 36.8 C 04/15/21 1355  Pulse 80 04/15/21 1401  Resp 10 04/15/21 1401  SpO2 97 % 04/15/21 1401  Vitals shown include unvalidated device data.  Last Pain:  Vitals:   04/15/21 0932  TempSrc: Oral         Complications: No notable events documented.

## 2021-04-15 NOTE — Anesthesia Preprocedure Evaluation (Addendum)
Anesthesia Evaluation  Patient identified by MRN, date of birth, ID band Patient awake    Reviewed: Allergy & Precautions, NPO status , Patient's Chart, lab work & pertinent test results  Airway Mallampati: I  TM Distance: >3 FB Neck ROM: Full    Dental  (+) Teeth Intact, Dental Advisory Given   Pulmonary neg pulmonary ROS,    breath sounds clear to auscultation       Cardiovascular negative cardio ROS   Rhythm:Regular Rate:Normal     Neuro/Psych Anxiety negative neurological ROS  negative psych ROS   GI/Hepatic negative GI ROS, Neg liver ROS,   Endo/Other  Morbid obesity  Renal/GU negative Renal ROS  negative genitourinary   Musculoskeletal  (+) Arthritis , Osteoarthritis,    Abdominal   Peds negative pediatric ROS (+)  Hematology negative hematology ROS (+)   Anesthesia Other Findings   Reproductive/Obstetrics negative OB ROS                            Lab Results  Component Value Date   WBC 8.6 04/11/2021   HGB 14.0 04/11/2021   HCT 41.8 04/11/2021   MCV 89.9 04/11/2021   PLT 285 04/11/2021   No results found for: INR, PROTIME  Anesthesia Physical  Anesthesia Plan  ASA: 2  Anesthesia Plan: General   Post-op Pain Management:    Induction: Intravenous  PONV Risk Score and Plan: 3 and Ondansetron, Dexamethasone and Midazolam  Airway Management Planned: Oral ETT and LMA  Additional Equipment: None  Intra-op Plan:   Post-operative Plan: Extubation in OR  Informed Consent: I have reviewed the patients History and Physical, chart, labs and discussed the procedure including the risks, benefits and alternatives for the proposed anesthesia with the patient or authorized representative who has indicated his/her understanding and acceptance.       Plan Discussed with: CRNA and Anesthesiologist  Anesthesia Plan Comments:         Anesthesia Quick Evaluation

## 2021-04-15 NOTE — Interval H&P Note (Signed)
History and Physical Interval Note:  04/15/2021 10:54 AM  Deborah Martin  has presented today for surgery, with the diagnosis of melanoma.  The various methods of treatment have been discussed with the patient and family. After consideration of risks, benefits and other options for treatment, the patient has consented to  Procedure(s) with comments: wide local EXCISION OF RIGHT BUTTOCK MELANOMA WITH SENTINEL LYMPH NODE BIOPSY (Right) - LMA as a surgical intervention.  The patient's history has been reviewed, patient examined, no change in status, stable for surgery.  I have reviewed the patient's chart and labs.  Questions were answered to the patient's satisfaction.  Lymphoscintigraphy localized sentinel lymph node to the right groin. Reviewed right inguinal sentinel node biopsy with the patient. Consent reviewed, surgical site confirmed and marked.    Dwan Bolt

## 2021-04-16 ENCOUNTER — Encounter (HOSPITAL_COMMUNITY): Payer: Self-pay | Admitting: Surgery

## 2021-04-18 ENCOUNTER — Ambulatory Visit: Payer: 59 | Admitting: Family Medicine

## 2021-04-18 LAB — SURGICAL PATHOLOGY

## 2021-04-29 ENCOUNTER — Other Ambulatory Visit (HOSPITAL_COMMUNITY): Payer: Self-pay

## 2021-04-29 DIAGNOSIS — Z6833 Body mass index (BMI) 33.0-33.9, adult: Secondary | ICD-10-CM | POA: Diagnosis not present

## 2021-04-29 DIAGNOSIS — F5104 Psychophysiologic insomnia: Secondary | ICD-10-CM | POA: Diagnosis not present

## 2021-04-29 DIAGNOSIS — N952 Postmenopausal atrophic vaginitis: Secondary | ICD-10-CM | POA: Diagnosis not present

## 2021-04-29 DIAGNOSIS — Z1272 Encounter for screening for malignant neoplasm of vagina: Secondary | ICD-10-CM | POA: Diagnosis not present

## 2021-04-29 DIAGNOSIS — N959 Unspecified menopausal and perimenopausal disorder: Secondary | ICD-10-CM | POA: Diagnosis not present

## 2021-04-29 DIAGNOSIS — Z01419 Encounter for gynecological examination (general) (routine) without abnormal findings: Secondary | ICD-10-CM | POA: Diagnosis not present

## 2021-04-29 LAB — HM PAP SMEAR: HM Pap smear: NORMAL

## 2021-04-29 MED ORDER — ZOLPIDEM TARTRATE ER 6.25 MG PO TBCR
6.2500 mg | EXTENDED_RELEASE_TABLET | Freq: Every evening | ORAL | 2 refills | Status: DC | PRN
  Filled 2021-04-29 – 2021-09-18 (×2): qty 30, 30d supply, fill #0

## 2021-04-29 MED ORDER — ESTRADIOL 1 MG PO TABS
1.0000 mg | ORAL_TABLET | Freq: Every day | ORAL | 3 refills | Status: DC
  Filled 2021-04-29 – 2021-06-25 (×2): qty 90, 90d supply, fill #0
  Filled 2021-09-18: qty 90, 90d supply, fill #1
  Filled 2021-12-16: qty 90, 90d supply, fill #2
  Filled 2022-03-26: qty 90, 90d supply, fill #3

## 2021-05-13 ENCOUNTER — Other Ambulatory Visit (HOSPITAL_COMMUNITY): Payer: Self-pay

## 2021-05-13 MED ORDER — ESCITALOPRAM OXALATE 10 MG PO TABS
10.0000 mg | ORAL_TABLET | Freq: Every day | ORAL | 1 refills | Status: DC
  Filled 2021-05-13: qty 90, 90d supply, fill #0
  Filled 2021-08-21: qty 90, 90d supply, fill #1

## 2021-05-14 ENCOUNTER — Other Ambulatory Visit (HOSPITAL_COMMUNITY): Payer: Self-pay

## 2021-05-17 DIAGNOSIS — C4359 Malignant melanoma of other part of trunk: Secondary | ICD-10-CM | POA: Diagnosis not present

## 2021-05-24 ENCOUNTER — Other Ambulatory Visit (HOSPITAL_COMMUNITY): Payer: Self-pay

## 2021-06-17 ENCOUNTER — Other Ambulatory Visit (HOSPITAL_COMMUNITY): Payer: Self-pay

## 2021-06-20 ENCOUNTER — Encounter: Payer: Self-pay | Admitting: Internal Medicine

## 2021-06-20 ENCOUNTER — Ambulatory Visit: Payer: 59 | Admitting: Internal Medicine

## 2021-06-20 VITALS — BP 116/72 | HR 70 | Temp 98.1°F | Ht 63.0 in | Wt 209.0 lb

## 2021-06-20 DIAGNOSIS — Z9079 Acquired absence of other genital organ(s): Secondary | ICD-10-CM | POA: Diagnosis not present

## 2021-06-20 DIAGNOSIS — E669 Obesity, unspecified: Secondary | ICD-10-CM | POA: Diagnosis not present

## 2021-06-20 DIAGNOSIS — Z90710 Acquired absence of both cervix and uterus: Secondary | ICD-10-CM

## 2021-06-20 DIAGNOSIS — Z1159 Encounter for screening for other viral diseases: Secondary | ICD-10-CM | POA: Diagnosis not present

## 2021-06-20 DIAGNOSIS — E663 Overweight: Secondary | ICD-10-CM

## 2021-06-20 DIAGNOSIS — R7301 Impaired fasting glucose: Secondary | ICD-10-CM | POA: Diagnosis not present

## 2021-06-20 DIAGNOSIS — Z90722 Acquired absence of ovaries, bilateral: Secondary | ICD-10-CM

## 2021-06-20 DIAGNOSIS — D485 Neoplasm of uncertain behavior of skin: Secondary | ICD-10-CM | POA: Diagnosis not present

## 2021-06-20 DIAGNOSIS — C4359 Malignant melanoma of other part of trunk: Secondary | ICD-10-CM | POA: Diagnosis not present

## 2021-06-20 DIAGNOSIS — D225 Melanocytic nevi of trunk: Secondary | ICD-10-CM | POA: Diagnosis not present

## 2021-06-20 DIAGNOSIS — D2262 Melanocytic nevi of left upper limb, including shoulder: Secondary | ICD-10-CM | POA: Diagnosis not present

## 2021-06-20 DIAGNOSIS — D224 Melanocytic nevi of scalp and neck: Secondary | ICD-10-CM | POA: Diagnosis not present

## 2021-06-20 DIAGNOSIS — E785 Hyperlipidemia, unspecified: Secondary | ICD-10-CM | POA: Diagnosis not present

## 2021-06-20 DIAGNOSIS — Z8041 Family history of malignant neoplasm of ovary: Secondary | ICD-10-CM | POA: Diagnosis not present

## 2021-06-20 DIAGNOSIS — Z79899 Other long term (current) drug therapy: Secondary | ICD-10-CM

## 2021-06-20 DIAGNOSIS — F5104 Psychophysiologic insomnia: Secondary | ICD-10-CM

## 2021-06-20 DIAGNOSIS — F411 Generalized anxiety disorder: Secondary | ICD-10-CM

## 2021-06-20 DIAGNOSIS — Z114 Encounter for screening for human immunodeficiency virus [HIV]: Secondary | ICD-10-CM | POA: Diagnosis not present

## 2021-06-20 DIAGNOSIS — C439 Malignant melanoma of skin, unspecified: Secondary | ICD-10-CM | POA: Insufficient documentation

## 2021-06-20 DIAGNOSIS — Z85820 Personal history of malignant melanoma of skin: Secondary | ICD-10-CM | POA: Diagnosis not present

## 2021-06-20 DIAGNOSIS — D1801 Hemangioma of skin and subcutaneous tissue: Secondary | ICD-10-CM | POA: Diagnosis not present

## 2021-06-20 LAB — POCT GLYCOSYLATED HEMOGLOBIN (HGB A1C): Hemoglobin A1C: 5.3 % (ref 4.0–5.6)

## 2021-06-20 LAB — HM MAMMOGRAPHY

## 2021-06-20 MED ORDER — WEGOVY 0.25 MG/0.5ML ~~LOC~~ SOAJ: 0.2500 mg | SUBCUTANEOUS | 2 refills | Status: DC

## 2021-06-20 NOTE — Progress Notes (Signed)
? ?Subjective:  ?Patient ID: Deborah Martin, female    DOB: 02/22/1966  Age: 56 y.o. MRN: [id number] ? ?CC: The primary encounter diagnosis was Hyperlipidemia, unspecified hyperlipidemia type. Diagnoses of Malignant melanoma of torso excluding breast ([place]), Long-term use of high-risk medication, Impaired fasting glucose, S/P total hysterectomy and BSO (bilateral salpingo-oophorectomy), Encounter for screening for HIV, Need for hepatitis C screening test, Obesity (BMI 35.0-39.9 without comorbidity), Family history of ovarian cancer, Generalized anxiety disorder, and Psychophysiological insomnia were also pertinent to this visit. ? ?HPI ?Deborah Martin presents for establishment of care .   ? ?Obesity:  patient has struggled to lose weight for years.  She has tried multiple diets and is exercising currently 3 to 4 days per week.  She has had limited success in achieving her goals and would like to try medication to suppress her appetite.  She has no personal history of pancreatitis and no personal or family history of thyroid cancer. ? ?Paternal history of suicide (father),  ovarian CA (mother ) and melanoma  (brother) ? ?Recently diagnosed with melanoma after a nonhealig skin lesion of her buttock was biopsied  ? ?History ?Deborah Martin has a past medical history of Anxiety, Arthritis, Cancer ([place]), and Restless legs.  ? ?She has a past surgical history that includes Cesarean section; Laparoscopic vaginal hysterectomy with salpingo oophorectomy (Bilateral, 03/06/2016); Tonsillectomy; Abdominal hysterectomy; and Excision melanoma with sentinel lymph node biopsy (Right, 04/15/2021).  ? ?Her family history includes Cancer (age of onset: 1) in her brother; Cancer (age of onset: 74) in her mother.She reports that she has never smoked. She has never used smokeless tobacco. She reports current alcohol use. She reports that she does not use drugs. ? ?Outpatient Medications Prior to Visit  ?Medication Sig Dispense Refill  ?  cetirizine (ZYRTEC) 10 MG tablet Take 10 mg by mouth daily as needed for allergies.    ? escitalopram (LEXAPRO) 10 MG tablet Take 1 tablet (10 mg total) by mouth daily. 90 tablet 1  ? estradiol (ESTRACE) 1 MG tablet TAKE 1 TABLET BY MOUTH DAILY 90 tablet 3  ? Multiple Vitamins-Minerals (MULTIVITAMIN WITH MINERALS) tablet Take 1 tablet by mouth daily.    ? pramipexole (MIRAPEX) 0.125 MG tablet Take 1 tablet (0.125 mg total) by mouth daily 2 to 3 hours before bedtime 90 tablet 1  ? Probiotic Product (ALIGN) 4 MG CAPS Take 4 mg by mouth every other day.    ? zolpidem (AMBIEN CR) 6.25 MG CR tablet Take 1 tablet (6.25 mg total) by mouth at bedtime as needed. 30 tablet 2  ? zolpidem (AMBIEN CR) 6.25 MG CR tablet Take 1 tablet (6.25 mg total) by mouth at bedtime. 30 tablet 5  ? zolpidem (AMBIEN CR) 6.25 MG CR tablet TAKE 1 TABLET AT BEDTIME AS NEEDED 30 tablet 2  ? busPIRone (BUSPAR) 10 MG tablet Take 1 tablet (10 mg total) by mouth 3 (three) times daily as needed. (Patient not taking: Reported on 06/20/2021) 30 tablet 1  ? COVID-19 At Home Antigen Test [facility] COVID-19 HOME TEST) KIT use as directed (Patient not taking: Reported on 04/09/2021) 4 each 0  ? dicyclomine (BENTYL) 20 MG tablet Take 1 tablet (20 mg total) by mouth 3 (three) times daily as needed for diarrhea and cramping. (Patient not taking: Reported on 06/20/2021) 90 tablet 0  ? HYDROcodone-acetaminophen (NORCO/VICODIN) 5-325 MG tablet Take 1 tablet by mouth every 6 (six) hours as needed for severe pain. (Patient not taking: Reported on 06/20/2021) 20 tablet 0  ? ?  No facility-administered medications prior to visit.  ? ? ?Review of Systems: ? ?Patient denies headache, fevers, malaise, unintentional weight loss, skin rash, eye pain, sinus congestion and sinus pain, sore throat, dysphagia,  hemoptysis , cough, dyspnea, wheezing, chest pain, palpitations, orthopnea, edema, abdominal pain, nausea, melena, diarrhea, constipation, flank pain, dysuria, hematuria,  urinary  Frequency, nocturia, numbness, tingling, seizures,  Focal weakness, Loss of consciousness,  Tremor, insomnia, depression, anxiety, and suicidal ideation.   ? ? ?Objective:  ?BP 116/72 (BP Location: Left Arm, Patient Position: Sitting, Cuff Size: Large)   Pulse 70   Temp 98.1 ?F (36.7 ?C) (Oral)   Ht 5' 3"  (1.6 m)   Wt 209 lb (94.8 kg)   LMP 01/09/2016 (Approximate)   SpO2 97%   BMI 37.02 kg/m?  ? ?Physical Exam: ? ?General appearance: alert, cooperative and appears stated age ?Ears: normal TM's and external ear canals both ears ?Throat: lips, mucosa, and tongue normal; teeth and gums normal ?Neck: no adenopathy, no carotid bruit, supple, symmetrical, trachea midline and thyroid not enlarged, symmetric, no tenderness/mass/nodules ?Back: symmetric, no curvature. ROM normal. No CVA tenderness. ?Lungs: clear to auscultation bilaterally ?Heart: regular rate and rhythm, S1, S2 normal, no murmur, click, rub or gallop ?Abdomen: soft, non-tender; bowel sounds normal; no masses,  no organomegaly ?Pulses: 2+ and symmetric ?Skin: Skin color, texture, turgor normal. No rashes or lesions ?Lymph nodes: Cervical, supraclavicular, and axillary nodes normal.  ?Assessment & Plan:  ? ?Problem List Items Addressed This Visit   ? ? S/P total hysterectomy and BSO (bilateral salpingo-oophorectomy)  ? Relevant Orders  ? POCT HgB A1C (Completed)  ? Obesity (BMI 35.0-39.9 without comorbidity)  ?   She has trouble suppressing appetite and requesting Wegovy.  Patient has no contraindications to using this medication.  The risks  and benefits were reviewed, and instructions on titration of dose were outlined.  .  ? ?  ?  ? Relevant Medications  ? Semaglutide-Weight Management (WEGOVY) 0.25 MG/0.5ML SOAJ  ? Malignant melanoma ([place])  ?  She is recovery from a deep biopsy of her buttock.  Continue q3 month dermatology checkups for one year.  ? ?  ?  ? Insomnia  ?  Chronic, with no improvement using over-the-counter first generation  antihistamines. Reviewed principles of good sleep hygiene. Although she is a snorer, there is no report of apneic spells by husband. Continue use of  Ambien  CR  ? ?  ?  ? Generalized anxiety disorder  ?  She has tried to wean herself off of lexapro in the recent past but felt more irritable without it. . Continue long term use  ? ?  ?  ? Family history of ovarian cancer  ?  She underwent prophylactic  TAH/BSO ? ?  ?  ? ?Other Visit Diagnoses   ? ? Hyperlipidemia, unspecified hyperlipidemia type    -  Primary  ? Relevant Orders  ? Direct LDL  ? Lipid Profile  ? Long-term use of high-risk medication      ? Relevant Orders  ? CBC with Differential/Platelet  ? Comp Met (CMET)  ? TSH  ? Impaired fasting glucose      ? Relevant Orders  ? POCT HgB A1C (Completed)  ? HgB A1c  ? Urine Microalbumin w/creat. ratio  ? Encounter for screening for HIV      ? Relevant Orders  ? HIV antibody (with reflex)  ? Need for hepatitis C screening test      ? Relevant  Orders  ? Hepatitis C Antibody  ? ?  ? ?Meds ordered this encounter  ?Medications  ? Semaglutide-Weight Management (WEGOVY) 0.25 MG/0.5ML SOAJ  ?  Sig: Inject 0.25 mg into the skin once a week.  ?  Dispense:  2 mL  ?  Refill:  2  ? ? ?Medications Discontinued During This Encounter  ?Medication Reason  ? COVID-19 At Home Antigen Test [facility] COVID-19 HOME TEST) KIT   ? HYDROcodone-acetaminophen (NORCO/VICODIN) 5-325 MG tablet   ? dicyclomine (BENTYL) 20 MG tablet   ? busPIRone (BUSPAR) 10 MG tablet   ? ? ?Follow-up: Return in about 3 months (around 09/19/2021). ? ? ?Crecencio Mc, MD ?

## 2021-06-20 NOTE — Assessment & Plan Note (Addendum)
She is recovery from a deep biopsy of her buttock.  Continue q3 month dermatology checkups for one year.  ?

## 2021-06-20 NOTE — Patient Instructions (Addendum)
Ok to increase zyrtec to 2 times daily ? ?Check to see the active ingredient in your eye drops it may be Patanol for eyes  (olapatadine)  ? ? ?I am  recommending adding Wegovy  to help you lose weight   ? ?ozempic is a medication that is taken as a weekly subcutaneous injection. It is not insulin.  It  causes your pancreas to increase its  own insulin secretion  And also slows down the emptying of your stomach,  So it decreases your appetite and helps you lose weight. ? ?The dose for the first 4 weekly doses is 0.25 mg.  You may have mild nausea on the first or second day but this should resolve.  If not  ,  stop the medication.  ? ?As long as you are losing weight,  you can continue the dose you are on .  Only increase the dose to 0.5 mg after 4 weeks if your weight has plateaued.  Let me know when you need a refill and what dose you are taking.   ? ? ?If you get constipated,  try using magnesium citrate  250 mg tablet to help keep things moving  ?

## 2021-06-22 DIAGNOSIS — G4700 Insomnia, unspecified: Secondary | ICD-10-CM | POA: Insufficient documentation

## 2021-06-22 DIAGNOSIS — F411 Generalized anxiety disorder: Secondary | ICD-10-CM | POA: Insufficient documentation

## 2021-06-22 NOTE — Assessment & Plan Note (Signed)
She underwent prophylactic  TAH/BSO ?

## 2021-06-22 NOTE — Assessment & Plan Note (Addendum)
Chronic, with no improvement using over-the-counter first generation antihistamines. Reviewed principles of good sleep hygiene. Although she is a snorer, there is no report of apneic spells by husband. Continue use of  Ambien  CR  ?

## 2021-06-22 NOTE — Assessment & Plan Note (Signed)
She has trouble suppressing appetite  and requesting Wegovy.  Patient has no contraindications to using this medication.  The risks  and benefits were reviewed, and instructions on titration of dose were outlined.  .   

## 2021-06-22 NOTE — Assessment & Plan Note (Signed)
She has tried to wean herself off of lexapro in the recent past but felt more irritable without it. . Continue long term use  ?

## 2021-06-25 ENCOUNTER — Other Ambulatory Visit (HOSPITAL_COMMUNITY): Payer: Self-pay

## 2021-07-09 ENCOUNTER — Other Ambulatory Visit: Payer: Self-pay

## 2021-07-09 ENCOUNTER — Other Ambulatory Visit (HOSPITAL_COMMUNITY): Payer: Self-pay

## 2021-07-09 MED ORDER — WEGOVY 0.25 MG/0.5ML ~~LOC~~ SOAJ
0.2500 mg | SUBCUTANEOUS | 2 refills | Status: DC
  Filled 2021-07-09 – 2021-07-19 (×2): qty 2, 28d supply, fill #0

## 2021-07-11 ENCOUNTER — Telehealth: Payer: Self-pay

## 2021-07-11 NOTE — Telephone Encounter (Signed)
PA for Wegovy has been submitted on covermymeds.  °

## 2021-07-12 NOTE — Telephone Encounter (Signed)
LMTCB. Need to let pt know that Mancel Parsons has been approved through her insurance.

## 2021-07-12 NOTE — Telephone Encounter (Signed)
Spoke with pt and she is aware that Deborah Martin has been approved.

## 2021-07-17 ENCOUNTER — Other Ambulatory Visit (HOSPITAL_COMMUNITY): Payer: Self-pay

## 2021-07-19 ENCOUNTER — Other Ambulatory Visit (HOSPITAL_COMMUNITY): Payer: Self-pay

## 2021-07-19 ENCOUNTER — Other Ambulatory Visit: Payer: Self-pay

## 2021-08-16 ENCOUNTER — Other Ambulatory Visit (HOSPITAL_COMMUNITY): Payer: Self-pay

## 2021-08-20 LAB — HM PAP SMEAR: HM Pap smear: NORMAL

## 2021-08-21 ENCOUNTER — Other Ambulatory Visit (HOSPITAL_COMMUNITY): Payer: Self-pay

## 2021-08-22 ENCOUNTER — Other Ambulatory Visit (HOSPITAL_COMMUNITY): Payer: Self-pay

## 2021-08-23 ENCOUNTER — Other Ambulatory Visit (HOSPITAL_COMMUNITY): Payer: Self-pay

## 2021-08-23 MED ORDER — PRAMIPEXOLE DIHYDROCHLORIDE 0.125 MG PO TABS
0.1250 mg | ORAL_TABLET | Freq: Every evening | ORAL | 1 refills | Status: DC
  Filled 2021-08-23: qty 90, 90d supply, fill #0
  Filled 2021-11-15: qty 90, 90d supply, fill #1

## 2021-08-29 ENCOUNTER — Encounter: Payer: Self-pay | Admitting: Internal Medicine

## 2021-08-30 ENCOUNTER — Other Ambulatory Visit (HOSPITAL_COMMUNITY): Payer: Self-pay

## 2021-08-30 ENCOUNTER — Other Ambulatory Visit: Payer: Self-pay

## 2021-08-30 MED ORDER — WEGOVY 0.5 MG/0.5ML ~~LOC~~ SOAJ
0.5000 mg | SUBCUTANEOUS | 3 refills | Status: DC
  Filled 2021-08-30 (×2): qty 2, 28d supply, fill #0

## 2021-08-31 ENCOUNTER — Other Ambulatory Visit (HOSPITAL_COMMUNITY): Payer: Self-pay

## 2021-09-16 ENCOUNTER — Other Ambulatory Visit (HOSPITAL_COMMUNITY): Payer: Self-pay

## 2021-09-18 ENCOUNTER — Other Ambulatory Visit (HOSPITAL_COMMUNITY): Payer: Self-pay

## 2021-09-19 ENCOUNTER — Other Ambulatory Visit (HOSPITAL_COMMUNITY): Payer: Self-pay

## 2021-09-19 ENCOUNTER — Ambulatory Visit: Payer: 59 | Admitting: Internal Medicine

## 2021-09-19 ENCOUNTER — Encounter: Payer: Self-pay | Admitting: Internal Medicine

## 2021-09-19 DIAGNOSIS — C4371 Malignant melanoma of right lower limb, including hip: Secondary | ICD-10-CM | POA: Diagnosis not present

## 2021-09-19 DIAGNOSIS — G2581 Restless legs syndrome: Secondary | ICD-10-CM | POA: Diagnosis not present

## 2021-09-19 DIAGNOSIS — E669 Obesity, unspecified: Secondary | ICD-10-CM

## 2021-09-19 DIAGNOSIS — F411 Generalized anxiety disorder: Secondary | ICD-10-CM

## 2021-09-19 DIAGNOSIS — K589 Irritable bowel syndrome without diarrhea: Secondary | ICD-10-CM | POA: Insufficient documentation

## 2021-09-19 DIAGNOSIS — K581 Irritable bowel syndrome with constipation: Secondary | ICD-10-CM

## 2021-09-19 MED ORDER — ONDANSETRON HCL 4 MG PO TABS
4.0000 mg | ORAL_TABLET | Freq: Three times a day (TID) | ORAL | 0 refills | Status: DC | PRN
  Filled 2021-09-19: qty 20, 7d supply, fill #0

## 2021-09-19 MED ORDER — ZOLPIDEM TARTRATE ER 6.25 MG PO TBCR
6.2500 mg | EXTENDED_RELEASE_TABLET | Freq: Every day | ORAL | 5 refills | Status: DC
  Filled 2021-10-10 – 2021-10-16 (×2): qty 30, 30d supply, fill #0
  Filled 2021-11-15: qty 30, 30d supply, fill #1
  Filled 2021-12-16: qty 28, 28d supply, fill #2
  Filled 2021-12-17: qty 2, 2d supply, fill #2
  Filled 2022-01-14: qty 30, 30d supply, fill #3
  Filled 2022-02-26: qty 30, 30d supply, fill #4

## 2021-09-19 NOTE — Assessment & Plan Note (Signed)
She has been taking 0.5 mg weekly for 3 weeks and lost 4 lbs during that time, which included a vacation.  Exercising 1-2 times per week.  Advised to increase aerobic exercsie to 5 days per week (gradually),  complete 4 weeks of the 0.5 mg dose of ozempic followed by an increase to 1.0 mg weekly using the 0.5 mg syringes.Marland Kitchen

## 2021-09-19 NOTE — Progress Notes (Signed)
Subjective:  Patient ID: Deborah Martin, female    DOB: 10/31/1965  Age: 56 y.o. MRN: [id number]  CC: Diagnoses of Obesity (BMI 35.0-39.9 without comorbidity), Malignant melanoma of right lower extremity including hip ([place]), Generalized anxiety disorder, Irritable bowel syndrome with constipation, and Restless legs syndrome were pertinent to this visit.   HPI Deborah Martin presents for  Chief Complaint  Patient presents with   Follow-up    3 month follow up   Obesity:  3 month follow up on use of Wegovy.  She has lost 16 lbs since April and tolerating the medication without persistent nausea .  Has lost 4 lbs since increasing dose  to 0.5 3 weeks ago without nausea .  Working out in the pool with weights  once a week with trainer. .  Trying to walk but too hot  2) Restless legs:  prior screen for iron deficiency by former PCP  using mirape.  Breakthrough symptoms troubleseome   Outpatient Medications Prior to Visit  Medication Sig Dispense Refill   cetirizine (ZYRTEC) 10 MG tablet Take 10 mg by mouth daily as needed for allergies.     escitalopram (LEXAPRO) 10 MG tablet Take 1 tablet (10 mg total) by mouth daily. 90 tablet 1   estradiol (ESTRACE) 1 MG tablet TAKE 1 TABLET BY MOUTH DAILY 90 tablet 3   Multiple Vitamins-Minerals (MULTIVITAMIN WITH MINERALS) tablet Take 1 tablet by mouth daily.     pramipexole (MIRAPEX) 0.125 MG tablet Take 1 tablet (0.125 mg total) by mouth daily 2-3 hours before bedtime. 90 tablet 1   Probiotic Product (ALIGN) 4 MG CAPS Take 4 mg by mouth every other day.     Semaglutide-Weight Management (WEGOVY) 0.5 MG/0.5ML SOAJ Inject 0.5 mg into the skin once a week. 2 mL 3   zolpidem (AMBIEN CR) 6.25 MG CR tablet Take 1 tablet (6.25 mg total) by mouth at bedtime. 30 tablet 5   zolpidem (AMBIEN CR) 6.25 MG CR tablet Take 1 tablet (6.25 mg total) by mouth at bedtime as needed. 30 tablet 2   zolpidem (AMBIEN CR) 6.25 MG CR tablet Take 1 tablet (6.25 mg total)  by mouth at bedtime. 30 tablet 5   zolpidem (AMBIEN CR) 6.25 MG CR tablet Take 1 tablet (6.25 mg total) by mouth at bedtime as needed. 30 tablet 2   No facility-administered medications prior to visit.    Review of Systems;  Patient denies headache, fevers, malaise, unintentional weight loss, skin rash, eye pain, sinus congestion and sinus pain, sore throat, dysphagia,  hemoptysis , cough, dyspnea, wheezing, chest pain, palpitations, orthopnea, edema, abdominal pain, nausea, melena, diarrhea, constipation, flank pain, dysuria, hematuria, urinary  Frequency, nocturia, numbness, tingling, seizures,  Focal weakness, Loss of consciousness,  Tremor, insomnia, depression, anxiety, and suicidal ideation.      Objective:  BP 120/78 (BP Location: Left Arm, Patient Position: Sitting, Cuff Size: Large)   Pulse 77   Temp 98 F (36.7 C) (Oral)   Ht '5\' 3"'$  (1.6 m)   Wt 193 lb (87.5 kg)   LMP 01/09/2016 (Approximate)   SpO2 98%   BMI 34.19 kg/m   BP Readings from Last 3 Encounters:  09/19/21 120/78  06/20/21 116/72  04/15/21 121/63    Wt Readings from Last 3 Encounters:  09/19/21 193 lb (87.5 kg)  06/20/21 209 lb (94.8 kg)  04/11/21 195 lb (88.5 kg)    General appearance: alert, cooperative and appears stated age Ears: normal TM's and external ear  canals both ears Throat: lips, mucosa, and tongue normal; teeth and gums normal Neck: no adenopathy, no carotid bruit, supple, symmetrical, trachea midline and thyroid not enlarged, symmetric, no tenderness/mass/nodules Back: symmetric, no curvature. ROM normal. No CVA tenderness. Lungs: clear to auscultation bilaterally Heart: regular rate and rhythm, S1, S2 normal, no murmur, click, rub or gallop Abdomen: soft, non-tender; bowel sounds normal; no masses,  no organomegaly Pulses: 2+ and symmetric Skin: Skin color, texture, turgor normal. No rashes or lesions Lymph nodes: Cervical, supraclavicular, and axillary nodes normal.  Lab Results   Component Value Date   HGBA1C 5.3 06/20/2021    Lab Results  Component Value Date   CREATININE 0.7 11/20/2020    Lab Results  Component Value Date   WBC 8.6 04/11/2021   HGB 14.0 04/11/2021   HCT 41.8 04/11/2021   PLT 285 04/11/2021   ALT 13 11/20/2020   AST 16 11/20/2020   NA 138 11/20/2020   K 4.4 11/20/2020   CL 101 11/20/2020   CREATININE 0.7 11/20/2020   BUN 17 11/20/2020   CO2 24 (A) 11/20/2020   HGBA1C 5.3 06/20/2021    NM Sentinel Node Inj-No Rpt (Melanoma)  Result Date: 04/15/2021 Sulfur Colloid was injected by the Nuclear Medicine Technologist for sentinel lymph node localization.    Assessment & Plan:   Problem List Items Addressed This Visit     Malignant melanoma ([place])    She has fully recovered from a the deep biopsy of her buttock in February that showed no residual melanoma .  Continue q3 month dermatology checkups for one year.       Relevant Medications   ondansetron (ZOFRAN) 4 MG tablet   Obesity (BMI 35.0-39.9 without comorbidity)    She has been taking 0.5 mg weekly for 3 weeks and lost 4 lbs during that time, which included a vacation.  Exercising 1-2 times per week.  Advised to increase aerobic exercsie to 5 days per week (gradually),  complete 4 weeks of the 0.5 mg dose of ozempic followed by an increase to 1.0 mg weekly using the 0.5 mg syringes..       Generalized anxiety disorder    She has reduced her dose of lexapro to 5 mg daily with some increased tearfulness..  Reviewed the safety profile of lexapro,  Will continue to refill at 10 mg daily       IBS (irritable bowel syndrome)    Screened for celiac disease 2023 by former PCP with celiac panel.  Last colonoscopy normal in 2015      Relevant Medications   ondansetron (ZOFRAN) 4 MG tablet   Restless legs syndrome    suboptimal control on lowest dose of mirapex.  Symptoms start in early evening,  Advised to take dose at 6 pm and either double dose or take second dose of 0.125 mg  around 10 pm        I spent a total of  30 minutes with this patient in a face to face visit on the date of this encounter reviewing the last office visit with me ,  prior records form former PCP at [facility],  patient's activity level and hoem weights/eating habits,   and post visit ordering of testing and therapeutics.    Follow-up: No follow-ups on file.   Crecencio Mc, MD

## 2021-09-19 NOTE — Assessment & Plan Note (Signed)
She has reduced her dose of lexapro to 5 mg daily with some increased tearfulness..  Reviewed the safety profile of lexapro,  Will continue to refill at 10 mg daily

## 2021-09-19 NOTE — Assessment & Plan Note (Signed)
suboptimal control on lowest dose of mirapex.  Symptoms start in early evening,  Advised to take dose at 6 pm and either double dose or take second dose of 0.125 mg around 10 pm

## 2021-09-19 NOTE — Assessment & Plan Note (Addendum)
Screened for celiac disease 2023 by former PCP with celiac panel.  Last colonoscopy normal in 2015

## 2021-09-19 NOTE — Assessment & Plan Note (Addendum)
She has fully recovered from a the deep biopsy of her buttock in February that showed no residual melanoma .  Continue q3 month dermatology checkups for one year.

## 2021-09-20 DIAGNOSIS — D224 Melanocytic nevi of scalp and neck: Secondary | ICD-10-CM | POA: Diagnosis not present

## 2021-09-20 DIAGNOSIS — L720 Epidermal cyst: Secondary | ICD-10-CM | POA: Diagnosis not present

## 2021-09-20 DIAGNOSIS — D1801 Hemangioma of skin and subcutaneous tissue: Secondary | ICD-10-CM | POA: Diagnosis not present

## 2021-09-20 DIAGNOSIS — Z85820 Personal history of malignant melanoma of skin: Secondary | ICD-10-CM | POA: Diagnosis not present

## 2021-09-20 DIAGNOSIS — L918 Other hypertrophic disorders of the skin: Secondary | ICD-10-CM | POA: Diagnosis not present

## 2021-09-20 DIAGNOSIS — D225 Melanocytic nevi of trunk: Secondary | ICD-10-CM | POA: Diagnosis not present

## 2021-09-20 DIAGNOSIS — L814 Other melanin hyperpigmentation: Secondary | ICD-10-CM | POA: Diagnosis not present

## 2021-09-27 ENCOUNTER — Other Ambulatory Visit (HOSPITAL_COMMUNITY): Payer: Self-pay

## 2021-10-10 ENCOUNTER — Encounter: Payer: Self-pay | Admitting: Internal Medicine

## 2021-10-10 ENCOUNTER — Other Ambulatory Visit (HOSPITAL_COMMUNITY): Payer: Self-pay

## 2021-10-13 MED ORDER — SEMAGLUTIDE-WEIGHT MANAGEMENT 1 MG/0.5ML ~~LOC~~ SOAJ
1.0000 mg | SUBCUTANEOUS | 2 refills | Status: DC
  Filled 2021-10-13: qty 2, 28d supply, fill #0

## 2021-10-14 ENCOUNTER — Other Ambulatory Visit (HOSPITAL_COMMUNITY): Payer: Self-pay

## 2021-10-16 ENCOUNTER — Other Ambulatory Visit (HOSPITAL_COMMUNITY): Payer: Self-pay

## 2021-10-17 ENCOUNTER — Other Ambulatory Visit (HOSPITAL_COMMUNITY): Payer: Self-pay

## 2021-10-18 ENCOUNTER — Encounter: Payer: Self-pay | Admitting: Family Medicine

## 2021-10-18 ENCOUNTER — Telehealth (INDEPENDENT_AMBULATORY_CARE_PROVIDER_SITE_OTHER): Payer: 59 | Admitting: Family Medicine

## 2021-10-18 ENCOUNTER — Other Ambulatory Visit (HOSPITAL_COMMUNITY): Payer: Self-pay

## 2021-10-18 DIAGNOSIS — R0789 Other chest pain: Secondary | ICD-10-CM | POA: Diagnosis not present

## 2021-10-18 DIAGNOSIS — R6883 Chills (without fever): Secondary | ICD-10-CM

## 2021-10-18 DIAGNOSIS — R058 Other specified cough: Secondary | ICD-10-CM

## 2021-10-18 MED ORDER — AZITHROMYCIN 250 MG PO TABS
ORAL_TABLET | ORAL | 0 refills | Status: AC
  Filled 2021-10-18: qty 6, 5d supply, fill #0

## 2021-10-18 MED ORDER — BENZONATATE 200 MG PO CAPS
200.0000 mg | ORAL_CAPSULE | Freq: Two times a day (BID) | ORAL | 0 refills | Status: DC | PRN
  Filled 2021-10-18: qty 20, 10d supply, fill #0

## 2021-10-18 NOTE — Progress Notes (Signed)
MyChart Video Visit    Virtual Visit via Video Note   This visit type was conducted due to national recommendations for restrictions regarding the COVID-19 Pandemic (e.g. social distancing) in an effort to limit this patient's exposure and mitigate transmission in our community. This patient is at least at moderate risk for complications without adequate follow up. This format is felt to be most appropriate for this patient at this time. Physical exam was limited by quality of the video and audio technology used for the visit. CMA was able to get the patient set up on a video visit.  Patient location: Home. Patient and provider in visit Provider location: Office  I discussed the limitations of evaluation and management by telemedicine and the availability of in person appointments. The patient expressed understanding and agreed to proceed.  Visit Date: 10/18/2021  Today's healthcare provider: Harland Dingwall, NP-C     Subjective:    Patient ID: Deborah Martin, female    DOB: [birth year], 56 y.o.   MRN: [id number]  Chief Complaint  Patient presents with   Nasal Congestion    Started Tuesday afternoon, chest tightness started Wednesday   Sore Throat   ear pressure   Cough    Dry cough at first then turned into productive with green sputum    HPI  Complains of URI symptoms that started 4 days ago including chills, sore throat, ear pressure, rhinorrhea, nasal congestion and cough.  Cough is becoming more productive and worsening with dark green sputum. Also notes chest tightness, no wheezing or shortness of breath.  Denies actual fever, dizziness, N/V/D.   Decreased appetite.   Taking Mucinex day and night time. Vapocool lozenges and Ibuprofen   Grandson was sick with viral illness.   Negative Covid test yesterday.   No hx of lung disease.     Past Medical History:  Diagnosis Date   Anxiety    Arthritis    lower back   Cancer ([place])    Melanoma   Restless legs      Past Surgical History:  Procedure Laterality Date   ABDOMINAL HYSTERECTOMY     CESAREAN SECTION     EXCISION MELANOMA WITH SENTINEL LYMPH NODE BIOPSY Right 04/15/2021   Procedure: WIDE LOCAL EXCISION OF RIGHT BUTTOCK MELANOMA WITH SENTINEL LYMPH NODE BIOPSY;  Surgeon: Dwan Bolt, MD;  Location: [facility];  Service: General;  Laterality: Right;  LMA   LAPAROSCOPIC VAGINAL HYSTERECTOMY WITH SALPINGO OOPHORECTOMY Bilateral 03/06/2016   Procedure: LAPAROSCOPIC ASSISTED VAGINAL HYSTERECTOMY WITH BILATERAL SALPINGO OOPHORECTOMY;  Surgeon: Everlene Farrier, MD;  Location: [facility];  Service: Gynecology;  Laterality: Bilateral;   TONSILLECTOMY      Family History  Problem Relation Age of Onset   Cancer Mother 37       ovarian CA died at 62   Cancer Brother 73       melanoma    Social History   Socioeconomic History   Marital status: Married    Spouse name: Not on file   Number of children: Not on file   Years of education: Not on file   Highest education level: Not on file  Occupational History   Not on file  Tobacco Use   Smoking status: Never   Smokeless tobacco: Never  Vaping Use   Vaping Use: Never used  Substance and Sexual Activity   Alcohol use: Yes    Comment: occ   Drug use: No   Sexual activity: Not on file  Other Topics Concern   Not on file  Social History Narrative   Not on file   Social Determinants of Health   Financial Resource Strain: Not on file  Food Insecurity: Not on file  Transportation Needs: Not on file  Physical Activity: Not on file  Stress: Not on file  Social Connections: Not on file  Intimate Partner Violence: Not on file    Outpatient Medications Prior to Visit  Medication Sig Dispense Refill   cetirizine (ZYRTEC) 10 MG tablet Take 10 mg by mouth daily as needed for allergies.     escitalopram (LEXAPRO) 10 MG tablet Take 1 tablet (10 mg total) by mouth daily. 90 tablet 1   estradiol (ESTRACE) 1 MG tablet TAKE 1 TABLET BY MOUTH DAILY  90 tablet 3   Multiple Vitamins-Minerals (MULTIVITAMIN WITH MINERALS) tablet Take 1 tablet by mouth daily.     ondansetron (ZOFRAN) 4 MG tablet Take 1 tablet (4 mg total) by mouth every 8 (eight) hours as needed for nausea or vomiting. 20 tablet 0   pramipexole (MIRAPEX) 0.125 MG tablet Take 1 tablet (0.125 mg total) by mouth daily 2-3 hours before bedtime. 90 tablet 1   Probiotic Product (ALIGN) 4 MG CAPS Take 4 mg by mouth every other day.     [START ON 12/10/2021] Semaglutide-Weight Management 1 MG/0.5ML SOAJ Inject 1 mg into the skin once a week for 28 days. 2 mL 2   zolpidem (AMBIEN CR) 6.25 MG CR tablet Take 1 tablet (6.25 mg total) by mouth at bedtime. 30 tablet 5   No facility-administered medications prior to visit.    No Known Allergies  ROS     Objective:    Physical Exam  LMP 01/09/2016 (Approximate)  Wt Readings from Last 3 Encounters:  09/19/21 193 lb (87.5 kg)  06/20/21 209 lb (94.8 kg)  04/11/21 195 lb (88.5 kg)   Alert and oriented and in no acute distress.  Respirations unlabored.  She is able to speak in complete sentences without difficulty.  Normal speech, memory and mood.    Assessment & Plan:   Problem List Items Addressed This Visit   None Visit Diagnoses     Productive cough    -  Primary   Relevant Medications   azithromycin (ZITHROMAX) 250 MG tablet   benzonatate (TESSALON) 200 MG capsule   Chills       Chest tightness          Negative COVID test at home. She is not in any acute distress but is ill-appearing. Discussed symptom management including Mucinex, Flonase, Tylenol or ibuprofen, multivitamins with zinc and staying well-hydrated. Tessalon prescribed for cough. Azithromycin prescribed in case she is not improving in the next 24 to 48 hours.  She may follow-up here or with her PCP as needed.  I am having Deborah Martin start on azithromycin and benzonatate. I am also having her maintain her cetirizine, multivitamin with minerals,  Align, estradiol, escitalopram, pramipexole, zolpidem, ondansetron, and Semaglutide-Weight Management.  Meds ordered this encounter  Medications   azithromycin (ZITHROMAX) 250 MG tablet    Sig: Take 2 tablets on day 1, then 1 tablet daily on days 2 through 5    Dispense:  6 tablet    Refill:  0    Order Specific Question:   Supervising Provider    Answer:   Pricilla Holm A [[id number]]   benzonatate (TESSALON) 200 MG capsule    Sig: Take 1 capsule (200 mg total) by mouth 2 (  two) times daily as needed for cough.    Dispense:  20 capsule    Refill:  0    Order Specific Question:   Supervising Provider    Answer:   Pricilla Holm A [[id number]]    I discussed the assessment and treatment plan with the patient. The patient was provided an opportunity to ask questions and all were answered. The patient agreed with the plan and demonstrated an understanding of the instructions.   The patient was advised to call back or seek an in-person evaluation if the symptoms worsen or if the condition fails to improve as anticipated.  I provided 16 minutes of face-to-face time during this encounter.   Harland Dingwall, NP-C [facility] [place] [phone] (phone) [phone] (fax)  [place]

## 2021-10-18 NOTE — Patient Instructions (Signed)
I sent an antibiotic to your pharmacy, azithromycin, and I also sent a medication for cough called Tessalon.  You can take over-the-counter Mucinex as well as the Tessalon. I hope you feel better soon.

## 2021-11-07 ENCOUNTER — Encounter: Payer: Self-pay | Admitting: Internal Medicine

## 2021-11-07 ENCOUNTER — Other Ambulatory Visit (HOSPITAL_COMMUNITY): Payer: Self-pay

## 2021-11-07 MED ORDER — SEMAGLUTIDE-WEIGHT MANAGEMENT 1 MG/0.5ML ~~LOC~~ SOAJ
1.0000 mg | SUBCUTANEOUS | 2 refills | Status: DC
  Filled 2021-11-07: qty 2, 28d supply, fill #0

## 2021-11-07 NOTE — Telephone Encounter (Signed)
Okay to increase dose from 1 mg ?

## 2021-11-08 ENCOUNTER — Other Ambulatory Visit (HOSPITAL_COMMUNITY): Payer: Self-pay

## 2021-11-08 MED ORDER — SEMAGLUTIDE-WEIGHT MANAGEMENT 1.7 MG/0.75ML ~~LOC~~ SOAJ
1.7000 mg | SUBCUTANEOUS | 1 refills | Status: DC
  Filled 2021-11-08: qty 3, 28d supply, fill #0
  Filled 2021-12-07: qty 3, 28d supply, fill #1

## 2021-11-15 ENCOUNTER — Other Ambulatory Visit (HOSPITAL_COMMUNITY): Payer: Self-pay

## 2021-11-16 ENCOUNTER — Other Ambulatory Visit (HOSPITAL_COMMUNITY): Payer: Self-pay

## 2021-11-16 MED ORDER — ESCITALOPRAM OXALATE 10 MG PO TABS
10.0000 mg | ORAL_TABLET | Freq: Every day | ORAL | 1 refills | Status: DC
  Filled 2021-11-16: qty 90, 90d supply, fill #0
  Filled 2022-03-04: qty 90, 90d supply, fill #1

## 2021-11-18 ENCOUNTER — Other Ambulatory Visit (HOSPITAL_COMMUNITY): Payer: Self-pay

## 2021-12-09 ENCOUNTER — Other Ambulatory Visit (HOSPITAL_COMMUNITY): Payer: Self-pay

## 2021-12-10 ENCOUNTER — Other Ambulatory Visit (HOSPITAL_COMMUNITY): Payer: Self-pay

## 2021-12-16 ENCOUNTER — Other Ambulatory Visit (HOSPITAL_COMMUNITY): Payer: Self-pay

## 2021-12-17 ENCOUNTER — Other Ambulatory Visit (HOSPITAL_COMMUNITY): Payer: Self-pay

## 2021-12-24 DIAGNOSIS — D2272 Melanocytic nevi of left lower limb, including hip: Secondary | ICD-10-CM | POA: Diagnosis not present

## 2021-12-24 DIAGNOSIS — D1801 Hemangioma of skin and subcutaneous tissue: Secondary | ICD-10-CM | POA: Diagnosis not present

## 2021-12-24 DIAGNOSIS — Z85820 Personal history of malignant melanoma of skin: Secondary | ICD-10-CM | POA: Diagnosis not present

## 2021-12-24 DIAGNOSIS — L570 Actinic keratosis: Secondary | ICD-10-CM | POA: Diagnosis not present

## 2021-12-24 DIAGNOSIS — L813 Cafe au lait spots: Secondary | ICD-10-CM | POA: Diagnosis not present

## 2021-12-24 DIAGNOSIS — D225 Melanocytic nevi of trunk: Secondary | ICD-10-CM | POA: Diagnosis not present

## 2021-12-24 DIAGNOSIS — L821 Other seborrheic keratosis: Secondary | ICD-10-CM | POA: Diagnosis not present

## 2022-01-06 ENCOUNTER — Encounter: Payer: Self-pay | Admitting: Internal Medicine

## 2022-01-08 ENCOUNTER — Other Ambulatory Visit (HOSPITAL_COMMUNITY): Payer: Self-pay

## 2022-01-08 MED ORDER — PRAMIPEXOLE DIHYDROCHLORIDE 0.25 MG PO TABS
0.2500 mg | ORAL_TABLET | Freq: Every evening | ORAL | 1 refills | Status: DC
  Filled 2022-01-08: qty 90, 90d supply, fill #0
  Filled 2022-03-26: qty 90, 90d supply, fill #1

## 2022-01-08 MED ORDER — SEMAGLUTIDE-WEIGHT MANAGEMENT 2.4 MG/0.75ML ~~LOC~~ SOAJ
2.4000 mg | SUBCUTANEOUS | 2 refills | Status: DC
  Filled 2022-01-08: qty 3, 28d supply, fill #0
  Filled 2022-01-30: qty 3, 28d supply, fill #1
  Filled 2022-02-27 – 2022-03-07 (×2): qty 3, 28d supply, fill #2

## 2022-01-14 ENCOUNTER — Ambulatory Visit: Payer: 59 | Admitting: Internal Medicine

## 2022-01-15 ENCOUNTER — Other Ambulatory Visit (HOSPITAL_COMMUNITY): Payer: Self-pay

## 2022-01-15 ENCOUNTER — Ambulatory Visit: Payer: 59 | Admitting: Internal Medicine

## 2022-01-27 ENCOUNTER — Ambulatory Visit: Payer: 59 | Admitting: Internal Medicine

## 2022-01-27 ENCOUNTER — Encounter: Payer: Self-pay | Admitting: Internal Medicine

## 2022-01-27 VITALS — BP 120/72 | HR 88 | Temp 98.5°F | Ht 63.0 in | Wt 170.4 lb

## 2022-01-27 DIAGNOSIS — R5383 Other fatigue: Secondary | ICD-10-CM | POA: Diagnosis not present

## 2022-01-27 DIAGNOSIS — Z8261 Family history of arthritis: Secondary | ICD-10-CM

## 2022-01-27 DIAGNOSIS — M130 Polyarthritis, unspecified: Secondary | ICD-10-CM | POA: Diagnosis not present

## 2022-01-27 NOTE — Progress Notes (Unsigned)
Subjective:  Patient ID: Deborah Martin, female    DOB: 05/01/1965  Age: 56 y.o. MRN: [id number]  CC: The primary encounter diagnosis was Polyarthritis. Diagnoses of Other fatigue and Family history of rheumatoid arthritis were also pertinent to this visit.   HPI Deborah Martin presents for  Chief Complaint  Patient presents with   Acute Visit    Joint pain    Deborah Martin is a 56 yr old female who presents with concern for autoimmune arthritis after learning from her aunt that her deceased mother had RA in adulthood.  Deborah Martin reports bilateral shoulder pain and bilateral hand pain involving the DIP's that has been present for over 4 months on a daily basis. Fingers aching.  Shoulder pain limiting her internal rotation and disrupting her rest as a side sleeper.  She also notes a feeling of intense irritation of  the skin of both arms which is intermittent throughout the day but present daily by early evening . Skin sensation is described as feeling sunburnt ,  occurring also for the last 3 - 4  months.  Her arms are tender to palpation all the way down the arm  , no bruising ,  no redness or  rash.  No preceding trauma or recent COVID vaccination. Left shoulder was treated for frozen shoulder in the spring but did not improve. Right shoulder pain followed.   Elbows ok  but  right forearm feels achy  .  Has used advil /aleve with no help   Has been excessively tired, sleeps with ambien but not without .  Denies shortness of breath .  No unintentional weight loss,  cough.   Outpatient Medications Prior to Visit  Medication Sig Dispense Refill   cetirizine (ZYRTEC) 10 MG tablet Take 10 mg by mouth daily as needed for allergies.     escitalopram (LEXAPRO) 10 MG tablet Take 1 tablet (10 mg total) by mouth daily. 90 tablet 1   estradiol (ESTRACE) 1 MG tablet TAKE 1 TABLET BY MOUTH DAILY 90 tablet 3   Multiple Vitamins-Minerals (MULTIVITAMIN WITH MINERALS) tablet Take 1 tablet by mouth daily.      pramipexole (MIRAPEX) 0.25 MG tablet Take 1 tablet (0.25 mg total) by mouth at bedtime. 90 tablet 1   Probiotic Product (ALIGN) 4 MG CAPS Take 4 mg by mouth every other day.     Semaglutide-Weight Management 2.4 MG/0.75ML SOAJ Inject 2.4 mg into the skin once a week. 3 mL 2   zolpidem (AMBIEN CR) 6.25 MG CR tablet Take 1 tablet (6.25 mg total) by mouth at bedtime. 30 tablet 5   benzonatate (TESSALON) 200 MG capsule Take 1 capsule (200 mg total) by mouth 2 (two) times daily as needed for cough. (Patient not taking: Reported on 01/27/2022) 20 capsule 0   ondansetron (ZOFRAN) 4 MG tablet Take 1 tablet (4 mg total) by mouth every 8 (eight) hours as needed for nausea or vomiting. (Patient not taking: Reported on 01/27/2022) 20 tablet 0   No facility-administered medications prior to visit.    Review of Systems;  Patient denies headache, fevers, malaise, unintentional weight loss, skin rash, eye pain, sinus congestion and sinus pain, sore throat, dysphagia,  hemoptysis , cough, dyspnea, wheezing, chest pain, palpitations, orthopnea, edema, abdominal pain, nausea, melena, diarrhea, constipation, flank pain, dysuria, hematuria, urinary  Frequency, nocturia, numbness, tingling, seizures,  Focal weakness, Loss of consciousness,  Tremor, insomnia, depression, anxiety, and suicidal ideation.      Objective:  BP 120/72  Pulse 88   Temp 98.5 F (36.9 C) (Oral)   Ht _0  (1.6 m)   Wt 170 lb 6.4 oz (77.3 kg)   LMP 01/09/2016 (Approximate)   SpO2 96%   BMI 30.19 kg/m   BP Readings from Last 3 Encounters:  01/27/22 120/72  09/19/21 120/78  06/20/21 116/72    Wt Readings from Last 3 Encounters:  01/27/22 170 lb 6.4 oz (77.3 kg)  09/19/21 193 lb (87.5 kg)  06/20/21 209 lb (94.8 kg)    General appearance: alert, cooperative and appears stated age Ears: normal TM's and external ear canals both ears Throat: lips, mucosa, and tongue normal; teeth and gums normal Neck: no adenopathy, no carotid  bruit, supple, symmetrical, trachea midline and thyroid not enlarged, symmetric, no tenderness/mass/nodules Back: symmetric, no curvature. ROM normal. No CVA tenderness. Lungs: clear to auscultation bilaterally Heart: regular rate and rhythm, S1, S2 normal, no murmur, click, rub or gallop Abdomen: soft, non-tender; bowel sounds normal; no masses,  no organomegaly Pulses: 2+ and symmetric Skin: Skin color, texture, turgor normal. No rashes or lesions Lymph nodes: Cervical, supraclavicular, and axillary nodes normal. Neuro:  awake and interactive with normal mood and affect. Higher cortical functions are normal. Speech is clear without word-finding difficulty or dysarthria. Extraocular movements are intact. Visual fields of both eyes are grossly intact. Sensation to light touch is grossly intact bilaterally of upper and lower extremities. Motor examination shows 4+/5 symmetric hand grip and upper extremity and 5/5 lower extremity strength. There is no pronation or drift. Gait is non-ataxic   Lab Results  Component Value Date   HGBA1C 5.3 06/20/2021    Lab Results  Component Value Date   CREATININE 0.7 11/20/2020    Lab Results  Component Value Date   WBC 8.6 04/11/2021   HGB 14.0 04/11/2021   HCT 41.8 04/11/2021   PLT 285 04/11/2021   ALT 13 11/20/2020   AST 16 11/20/2020   NA 138 11/20/2020   K 4.4 11/20/2020   CL 101 11/20/2020   CREATININE 0.7 11/20/2020   BUN 17 11/20/2020   CO2 24 (A) 11/20/2020   HGBA1C 5.3 06/20/2021    NM Sentinel Node Inj-No Rpt (Melanoma)  Result Date: 04/15/2021 Sulfur Colloid was injected by the Nuclear Medicine Technologist for sentinel lymph node localization.    Assessment & Plan:   Problem List Items Addressed This Visit     Family history of rheumatoid arthritis    CURRENT EXAM IS NOT CONSISTENT WITH RA,  but will screen for RA and other autoimmune forms of polyarthritis.  For pain management I have recommended use of aleve and tylenol  jointly.       Other Visit Diagnoses     Polyarthritis    -  Primary   Relevant Orders   Sedimentation rate   C-reactive protein   HLA-B27 antigen   ANA w/Reflex if Positive   Uric acid   Rheumatoid factor (Completed)   Cyclic citrul peptide antibody, IgG   Ferritin   CBC with Differential/Platelet   Other fatigue       Relevant Orders   Comprehensive metabolic panel       I spent a total of 30  minutes with this patient in a face to face visit on the date of this encounter reviewing the last office visit with me,   most recent visit with Orthopedics, including labs and imaging studies ,   and post visit ordering of testing and therapeutics.  Follow-up: No follow-ups on file.   Crecencio Mc, MD

## 2022-01-27 NOTE — Patient Instructions (Signed)
For your pain :  Try taking 2 aleve and 2 tylenol (500 mg each)  every 12 hours   Rheumatology referral if any labs are elevated  Orthopedics /x ray if all normal

## 2022-01-28 DIAGNOSIS — Z8261 Family history of arthritis: Secondary | ICD-10-CM | POA: Insufficient documentation

## 2022-01-28 LAB — COMPREHENSIVE METABOLIC PANEL
ALT: 11 U/L (ref 0–35)
AST: 16 U/L (ref 0–37)
Albumin: 4.7 g/dL (ref 3.5–5.2)
Alkaline Phosphatase: 73 U/L (ref 39–117)
BUN: 16 mg/dL (ref 6–23)
CO2: 24 mEq/L (ref 19–32)
Calcium: 9.4 mg/dL (ref 8.4–10.5)
Chloride: 100 mEq/L (ref 96–112)
Creatinine, Ser: 0.64 mg/dL (ref 0.40–1.20)
GFR: 99.98 mL/min (ref >60.00)
Glucose, Bld: 76 mg/dL (ref 70–99)
Potassium: 3.8 mEq/L (ref 3.5–5.1)
Sodium: 136 mEq/L (ref 135–145)
Total Bilirubin: 0.4 mg/dL (ref 0.2–1.2)
Total Protein: 7.4 g/dL (ref 6.0–8.3)

## 2022-01-28 LAB — FERRITIN: Ferritin: 92 ng/mL (ref 10.0–291.0)

## 2022-01-28 LAB — C-REACTIVE PROTEIN: CRP: <1 mg/dL (ref 0.5–20.0)

## 2022-01-28 LAB — CBC WITH DIFFERENTIAL/PLATELET
Basophils Absolute: 0.1 10*3/uL (ref 0.0–0.1)
Basophils Relative: 0.9 % (ref 0.0–3.0)
Eosinophils Absolute: 0.1 10*3/uL (ref 0.0–0.7)
Eosinophils Relative: 1.1 % (ref 0.0–5.0)
HCT: 42.1 % (ref 36.0–46.0)
Hemoglobin: 14.7 g/dL (ref 12.0–15.0)
Lymphocytes Relative: 25.9 % (ref 12.0–46.0)
Lymphs Abs: 2.6 10*3/uL (ref 0.7–4.0)
MCHC: 35 g/dL (ref 30.0–36.0)
MCV: 87.1 fl (ref 78.0–100.0)
Monocytes Absolute: 0.9 10*3/uL (ref 0.1–1.0)
Monocytes Relative: 8.6 % (ref 3.0–12.0)
Neutro Abs: 6.4 10*3/uL (ref 1.4–7.7)
Neutrophils Relative %: 63.5 % (ref 43.0–77.0)
Platelets: 264 10*3/uL (ref 150.0–400.0)
RBC: 4.84 Mil/uL (ref 3.87–5.11)
RDW: 12.8 % (ref 11.5–15.5)
WBC: 10.1 10*3/uL (ref 4.0–10.5)

## 2022-01-28 LAB — ANA W/REFLEX IF POSITIVE: Anti Nuclear Antibody (ANA): NEGATIVE

## 2022-01-28 LAB — URIC ACID: Uric Acid, Serum: 2.3 mg/dL — ABNORMAL LOW (ref 2.4–7.0)

## 2022-01-28 LAB — SEDIMENTATION RATE: Sed Rate: 9 mm/hr (ref 0–30)

## 2022-01-28 NOTE — Assessment & Plan Note (Signed)
CURRENT EXAM IS NOT CONSISTENT WITH RA,  but will screen for RA and other autoimmune forms of polyarthritis.  For pain management I have recommended use of aleve and tylenol jointly.

## 2022-01-29 LAB — CYCLIC CITRUL PEPTIDE ANTIBODY, IGG: Cyclic Citrullin Peptide Ab: <16 UNITS

## 2022-01-29 LAB — HLA-B27 ANTIGEN: HLA-B27 Antigen: NEGATIVE

## 2022-01-29 LAB — RHEUMATOID FACTOR: Rheumatoid fact SerPl-aCnc: <14 IU/mL (ref <14)

## 2022-01-30 ENCOUNTER — Encounter: Payer: Self-pay | Admitting: Internal Medicine

## 2022-02-26 ENCOUNTER — Other Ambulatory Visit: Payer: Self-pay

## 2022-03-05 ENCOUNTER — Other Ambulatory Visit (HOSPITAL_COMMUNITY): Payer: Self-pay

## 2022-03-06 ENCOUNTER — Telehealth: Payer: Self-pay

## 2022-03-06 ENCOUNTER — Other Ambulatory Visit (HOSPITAL_COMMUNITY): Payer: Self-pay

## 2022-03-06 NOTE — Telephone Encounter (Signed)
PA for [facility] required.

## 2022-03-07 ENCOUNTER — Other Ambulatory Visit (HOSPITAL_COMMUNITY): Payer: Self-pay

## 2022-03-10 ENCOUNTER — Telehealth: Payer: Self-pay | Admitting: *Deleted

## 2022-03-10 ENCOUNTER — Other Ambulatory Visit (HOSPITAL_COMMUNITY): Payer: Self-pay

## 2022-03-10 NOTE — Telephone Encounter (Signed)
PA started covermymeds 

## 2022-03-11 NOTE — Telephone Encounter (Signed)
Looks like a PA was started in a separate encounter, is there a CMM key available for the PA that has already been started?

## 2022-03-11 NOTE — Telephone Encounter (Signed)
We so not have a CMM key

## 2022-03-13 ENCOUNTER — Telehealth: Payer: Self-pay

## 2022-03-13 ENCOUNTER — Other Ambulatory Visit (HOSPITAL_COMMUNITY): Payer: Self-pay

## 2022-03-13 NOTE — Telephone Encounter (Signed)
Patient aware  Your prior authorization for Mancel Parsons has been approved! MORE INFO Personalized support and financial assistance may be available through the [employer] program. For more information, and to see program requirements, click on the More Info button to the right.  Message from plan: The request has been approved. The authorization is effective from 03/13/2022 to 03/13/2023, as long as the member is enrolled in their current health plan. The request was reviewed and approved by a licensed clinical pharmacist. A first prior authorization for Mancel Parsons has been entered for the dose of 2.'4mg'$ /0.'75mg'$ , this request is limited to 34m per 28 days. Reference authorization [id number] second prior authorization for [facility] for the dose of 1.'7mg'$ /0.7529m this request is limited to 29m50mer 28 days. Reference authorization [id number] prior authorization for [facility] entered for the dose of '1mg'$ /0.5ml49mhis request is limited to 2ml 42m 28 days. Reference authorization [id number] prior authorization for [facility] for the dose of 0.'5mg'$ /0.5ml, 69ms request is limited to 2ml pe30m8 days. Reference authorization [id number] prior authorization for [facility] entered for the dose of 0.'25mg'$ /0.5ml, th36mrequest is limited to 2ml per 75mdays. Reference authorization [id number]. [id number] [phone] notification letter will follow with additional details.

## 2022-03-13 NOTE — Telephone Encounter (Signed)
Sent mychart message letting pt know that wegovy has been approved.

## 2022-03-14 ENCOUNTER — Other Ambulatory Visit (HOSPITAL_COMMUNITY): Payer: Self-pay

## 2022-03-26 ENCOUNTER — Other Ambulatory Visit (HOSPITAL_COMMUNITY): Payer: Self-pay

## 2022-03-31 ENCOUNTER — Other Ambulatory Visit (HOSPITAL_COMMUNITY): Payer: Self-pay

## 2022-03-31 MED ORDER — ESCITALOPRAM OXALATE 10 MG PO TABS
10.0000 mg | ORAL_TABLET | Freq: Every day | ORAL | 1 refills | Status: DC
  Filled 2022-03-31: qty 90, 90d supply, fill #0
  Filled 2022-06-30: qty 30, 30d supply, fill #0
  Filled 2022-07-28: qty 30, 30d supply, fill #1
  Filled 2022-08-27: qty 30, 30d supply, fill #2
  Filled 2022-09-29: qty 30, 30d supply, fill #3
  Filled 2022-11-13: qty 30, 30d supply, fill #4
  Filled 2022-12-12: qty 30, 30d supply, fill #5

## 2022-04-02 ENCOUNTER — Other Ambulatory Visit (HOSPITAL_COMMUNITY): Payer: Self-pay

## 2022-04-04 ENCOUNTER — Other Ambulatory Visit (HOSPITAL_COMMUNITY): Payer: Self-pay

## 2022-04-04 MED ORDER — ZOLPIDEM TARTRATE ER 6.25 MG PO TBCR
6.2500 mg | EXTENDED_RELEASE_TABLET | Freq: Every day | ORAL | 0 refills | Status: DC
  Filled 2022-04-04: qty 30, 30d supply, fill #0

## 2022-04-10 ENCOUNTER — Encounter: Payer: Self-pay | Admitting: Internal Medicine

## 2022-04-11 NOTE — Telephone Encounter (Signed)
We do not have any Wegovy samples.

## 2022-04-29 ENCOUNTER — Ambulatory Visit: Payer: Commercial Managed Care - PPO | Admitting: Nurse Practitioner

## 2022-04-29 ENCOUNTER — Other Ambulatory Visit (HOSPITAL_COMMUNITY): Payer: Self-pay

## 2022-04-29 ENCOUNTER — Encounter: Payer: Self-pay | Admitting: Nurse Practitioner

## 2022-04-29 VITALS — BP 112/70 | HR 83 | Temp 97.8°F

## 2022-04-29 DIAGNOSIS — J069 Acute upper respiratory infection, unspecified: Secondary | ICD-10-CM

## 2022-04-29 MED ORDER — FLUTICASONE PROPIONATE 50 MCG/ACT NA SUSP
2.0000 | Freq: Every day | NASAL | 2 refills | Status: DC
  Filled 2022-04-29: qty 16, 30d supply, fill #0
  Filled 2022-08-14: qty 16, 30d supply, fill #1

## 2022-04-29 NOTE — Progress Notes (Addendum)
Acute Office Visit  Subjective:     Patient ID: Deborah Martin, female    DOB: 1965-11-13, 57 y.o.   MRN: [id number]  Chief Complaint  Patient presents with   URI   Patient presents today, not feeling well. Reports symptoms started x 1 week. Initially experienced sore throat which resolved in 2- 3 days. Over the weekend she experienced increased fatigue, cough, and some wheezing due to congestion. Also endorses sinus pressure. She reports she woke up one night sweating, and thinks she may have had a fever. Has not had any since that episode.  She has been using mucinex at home which has helped some.  Patient admits that she is feeling better today.   Review of Systems  Constitutional:  Negative for chills and fever.  HENT:  Positive for congestion. Negative for ear discharge, ear pain, sinus pain, sore throat and tinnitus.   Respiratory:  Positive for cough. Negative for shortness of breath.   Cardiovascular:  Negative for chest pain.  Musculoskeletal:  Negative for myalgias.  Neurological:  Positive for dizziness (slightly dizzy).        Objective:    BP 112/70   Pulse 83   Temp 97.8 F (36.6 C)   LMP 01/09/2016 (Approximate)   SpO2 98%    Physical Exam Constitutional:      General: She is not in acute distress. HENT:     Head: Normocephalic.     Right Ear: Tympanic membrane, ear canal and external ear normal.     Left Ear: Tympanic membrane, ear canal and external ear normal.     Ears:     Comments: Slight fluid noted behind TM    Mouth/Throat:     Pharynx: Oropharynx is clear. No oropharyngeal exudate or posterior oropharyngeal erythema.  Eyes:     Pupils: Pupils are equal, round, and reactive to light.  Cardiovascular:     Rate and Rhythm: Normal rate and regular rhythm.     Heart sounds: Normal heart sounds.  Pulmonary:     Effort: Pulmonary effort is normal. No respiratory distress.     Breath sounds: Normal breath sounds.  Musculoskeletal:         General: Normal range of motion.  Neurological:     General: No focal deficit present.     Mental Status: She is alert and oriented to person, place, and time.     No results found for any visits on 04/29/22.      Assessment & Plan:   Problem List Items Addressed This Visit   None Visit Diagnoses     Viral upper respiratory illness    -  Primary   Relevant Medications   fluticasone (FLONASE) 50 MCG/ACT nasal spray  Physical exam and vitals are reassuring today. Recommended symptomatic treatment at this time with flonase, tessalon perles, and continue use of mucinex. Tessalon Perles prescription not needed at this time as patient reports she has a prior prescription at home.  Follow-up if symptoms worsen or fail to improve.      Meds ordered this encounter  Medications   fluticasone (FLONASE) 50 MCG/ACT nasal spray    Sig: Place 2 sprays into both nostrils daily.    Dispense:  16 g    Refill:  2    Order Specific Question:   Supervising Provider    Answer:   Virginia Crews [id number]    Follow-up as needed. Lurena Joiner, NP

## 2022-05-13 ENCOUNTER — Encounter: Payer: Self-pay | Admitting: Internal Medicine

## 2022-05-14 ENCOUNTER — Other Ambulatory Visit (HOSPITAL_COMMUNITY): Payer: Self-pay

## 2022-05-14 MED ORDER — ZOLPIDEM TARTRATE ER 6.25 MG PO TBCR
6.2500 mg | EXTENDED_RELEASE_TABLET | Freq: Every day | ORAL | 5 refills | Status: DC
  Filled 2022-05-14: qty 30, 30d supply, fill #0
  Filled 2022-06-12: qty 30, 30d supply, fill #1
  Filled 2022-07-14: qty 30, 30d supply, fill #2
  Filled 2022-08-14: qty 30, 30d supply, fill #3
  Filled 2022-09-09: qty 30, 30d supply, fill #4
  Filled 2022-10-12: qty 30, 30d supply, fill #5

## 2022-06-13 ENCOUNTER — Other Ambulatory Visit: Payer: Self-pay

## 2022-06-13 ENCOUNTER — Other Ambulatory Visit (HOSPITAL_COMMUNITY): Payer: Self-pay

## 2022-06-30 ENCOUNTER — Other Ambulatory Visit (HOSPITAL_COMMUNITY): Payer: Self-pay

## 2022-06-30 ENCOUNTER — Other Ambulatory Visit: Payer: Self-pay | Admitting: Internal Medicine

## 2022-07-01 ENCOUNTER — Other Ambulatory Visit: Payer: Self-pay

## 2022-07-01 ENCOUNTER — Other Ambulatory Visit (HOSPITAL_COMMUNITY): Payer: Self-pay

## 2022-07-01 MED ORDER — PRAMIPEXOLE DIHYDROCHLORIDE 0.25 MG PO TABS
0.2500 mg | ORAL_TABLET | Freq: Every evening | ORAL | 1 refills | Status: DC
  Filled 2022-07-01: qty 30, 30d supply, fill #0
  Filled 2022-07-28: qty 30, 30d supply, fill #1
  Filled 2022-08-27: qty 30, 30d supply, fill #2
  Filled 2022-09-29: qty 30, 30d supply, fill #3
  Filled 2022-10-27: qty 30, 30d supply, fill #4
  Filled 2022-11-22: qty 30, 30d supply, fill #5

## 2022-07-01 MED ORDER — ESTRADIOL 1 MG PO TABS
1.0000 mg | ORAL_TABLET | Freq: Every day | ORAL | 3 refills | Status: DC
  Filled 2022-07-01: qty 30, 30d supply, fill #0
  Filled 2022-07-28: qty 30, 30d supply, fill #1
  Filled 2022-08-27: qty 30, 30d supply, fill #2
  Filled 2022-09-29: qty 30, 30d supply, fill #3
  Filled 2022-10-27: qty 30, 30d supply, fill #4
  Filled 2022-12-01: qty 30, 30d supply, fill #5
  Filled 2023-01-01: qty 30, 30d supply, fill #6
  Filled 2023-02-02: qty 30, 30d supply, fill #7
  Filled 2023-03-05: qty 30, 30d supply, fill #8
  Filled 2023-05-05: qty 30, 30d supply, fill #10

## 2022-07-14 ENCOUNTER — Other Ambulatory Visit: Payer: Self-pay

## 2022-07-17 ENCOUNTER — Encounter: Payer: Self-pay | Admitting: Internal Medicine

## 2022-07-17 ENCOUNTER — Ambulatory Visit: Payer: Self-pay | Attending: Internal Medicine

## 2022-07-17 ENCOUNTER — Ambulatory Visit (INDEPENDENT_AMBULATORY_CARE_PROVIDER_SITE_OTHER): Payer: Self-pay | Admitting: Internal Medicine

## 2022-07-17 VITALS — BP 118/62 | HR 79 | Ht 63.0 in | Wt 178.0 lb

## 2022-07-17 DIAGNOSIS — R002 Palpitations: Secondary | ICD-10-CM

## 2022-07-17 LAB — BASIC METABOLIC PANEL
BUN: 17 mg/dL (ref 6–23)
CO2: 29 mEq/L (ref 19–32)
Calcium: 9.3 mg/dL (ref 8.4–10.5)
Chloride: 102 mEq/L (ref 96–112)
Creatinine, Ser: 0.68 mg/dL (ref 0.40–1.20)
GFR: 98.21 mL/min (ref >60.00)
Glucose, Bld: 87 mg/dL (ref 70–99)
Potassium: 4.3 mEq/L (ref 3.5–5.1)
Sodium: 138 mEq/L (ref 135–145)

## 2022-07-17 LAB — CBC WITH DIFFERENTIAL/PLATELET
Basophils Absolute: 0.1 10*3/uL (ref 0.0–0.1)
Basophils Relative: 0.8 % (ref 0.0–3.0)
Eosinophils Absolute: 0.2 10*3/uL (ref 0.0–0.7)
Eosinophils Relative: 2 % (ref 0.0–5.0)
HCT: 41.3 % (ref 36.0–46.0)
Hemoglobin: 14 g/dL (ref 12.0–15.0)
Lymphocytes Relative: 40 % (ref 12.0–46.0)
Lymphs Abs: 3.1 10*3/uL (ref 0.7–4.0)
MCHC: 33.8 g/dL (ref 30.0–36.0)
MCV: 88.6 fl (ref 78.0–100.0)
Monocytes Absolute: 0.7 10*3/uL (ref 0.1–1.0)
Monocytes Relative: 8.6 % (ref 3.0–12.0)
Neutro Abs: 3.7 10*3/uL (ref 1.4–7.7)
Neutrophils Relative %: 48.6 % (ref 43.0–77.0)
Platelets: 252 10*3/uL (ref 150.0–400.0)
RBC: 4.67 Mil/uL (ref 3.87–5.11)
RDW: 12.5 % (ref 11.5–15.5)
WBC: 7.7 10*3/uL (ref 4.0–10.5)

## 2022-07-17 LAB — TSH: TSH: 2.4 u[IU]/mL (ref 0.35–5.50)

## 2022-07-17 LAB — MAGNESIUM: Magnesium: 1.9 mg/dL (ref 1.5–2.5)

## 2022-07-17 NOTE — Patient Instructions (Addendum)
I have ordered a ZIO monitor to wear for 14 days to catch the episodes of rapid heart rate   Please send me a message when you are sending it back

## 2022-07-17 NOTE — Progress Notes (Signed)
Subjective:  Patient ID: Deborah Martin, female    DOB: 1965/07/05  Age: 57 y.o. MRN: [id number]  CC: The encounter diagnosis was Palpitations.   HPI Deborah Martin presents for  Chief Complaint  Patient presents with   Palpitations    Notices random fluctuation in HR, feels like it is racing sometimes, fam hx A-Fib    Deborah Martin is a 57 yr old female presents with episodes of heart racing.  Episodes have been occurring at rest  at work , and while driving  and are accompanied by  feelimh light headed and tired.  Episodes have lasted several minutes and occur 1-2 times daily   the most frequent was 4 times daily .   Does not occur at night (but takes [place] ) her apple watch noted a HR of 42 one morning while awake .  Episodes have been recorded as high as 140    Ditt reviewed:  1 cup of caffeinated coffee daily.    No appetite suppressants except Wegovy, restarted in March at the lowest dose.  Not occurring while fasting.  Some increased stress , new job and daughter getting married.  Taking lexapro   Enjoying her new job   New medications: she started testosterone replacement 2 weeks ago.  (After palpitaitons started);  had a 24 hr GI illness (vomiting)  preceding the onset of palpitations       Outpatient Medications Prior to Visit  Medication Sig Dispense Refill   cetirizine (ZYRTEC) 10 MG tablet Take 10 mg by mouth daily as needed for allergies.     EC-RX Testosterone 0.4 % CREA Place onto the skin. Applied twice per week     escitalopram (LEXAPRO) 10 MG tablet Take 1 tablet (10 mg total) by mouth daily. 90 tablet 1   estradiol (ESTRACE) 1 MG tablet Take 1 tablet (1 mg total) by mouth daily. 90 tablet 3   fluticasone (FLONASE) 50 MCG/ACT nasal spray Place 2 sprays into both nostrils daily. 16 g 2   Multiple Vitamins-Minerals (MULTIVITAMIN WITH MINERALS) tablet Take 1 tablet by mouth daily.     pramipexole (MIRAPEX) 0.25 MG tablet Take 1 tablet (0.25 mg total) by mouth at  bedtime. 90 tablet 1   Semaglutide, 1 MG/DOSE, 4 MG/3ML SOPN Inject 4 mg into the skin once a week.     zolpidem (AMBIEN CR) 6.25 MG CR tablet Take 1 tablet (6.25 mg total) by mouth at bedtime. 30 tablet 5   No facility-administered medications prior to visit.    Review of Systems;  Patient denies headache, fevers, malaise, unintentional weight loss, skin rash, eye pain, sinus congestion and sinus pain, sore throat, dysphagia,  hemoptysis , cough, dyspnea, wheezing, chest pain, palpitations, orthopnea, edema, abdominal pain, nausea, melena, diarrhea, constipation, flank pain, dysuria, hematuria, urinary  Frequency, nocturia, numbness, tingling, seizures,  Focal weakness, Loss of consciousness,  Tremor, insomnia, depression, anxiety, and suicidal ideation.      Objective:  BP 118/62   Pulse 79   Ht 5\' 3"  (1.6 m)   Wt 178 lb (80.7 kg)   LMP 01/09/2016 (Approximate)   SpO2 98%   BMI 31.53 kg/m   BP Readings from Last 3 Encounters:  07/17/22 118/62  04/29/22 112/70  01/27/22 120/72    Wt Readings from Last 3 Encounters:  07/17/22 178 lb (80.7 kg)  01/27/22 170 lb 6.4 oz (77.3 kg)  09/19/21 193 lb (87.5 kg)    Physical Exam Vitals reviewed.  Constitutional:  General: She is not in acute distress.    Appearance: Normal appearance. She is normal weight. She is not ill-appearing, toxic-appearing or diaphoretic.  HENT:     Head: Normocephalic.  Eyes:     General: No scleral icterus.       Right eye: No discharge.        Left eye: No discharge.     Conjunctiva/sclera: Conjunctivae normal.  Cardiovascular:     Rate and Rhythm: Normal rate and regular rhythm.     Heart sounds: Normal heart sounds.  Pulmonary:     Effort: Pulmonary effort is normal. No respiratory distress.     Breath sounds: Normal breath sounds.  Musculoskeletal:        General: Normal range of motion.  Skin:    General: Skin is warm and dry.  Neurological:     General: No focal deficit present.      Mental Status: She is alert and oriented to person, place, and time. Mental status is at baseline.  Psychiatric:        Mood and Affect: Mood normal.        Behavior: Behavior normal.        Thought Content: Thought content normal.        Judgment: Judgment normal.    Lab Results  Component Value Date   HGBA1C 5.3 06/20/2021    Lab Results  Component Value Date   CREATININE 0.68 07/17/2022   CREATININE 0.64 01/27/2022   CREATININE 0.7 11/20/2020    Lab Results  Component Value Date   WBC 7.7 07/17/2022   HGB 14.0 07/17/2022   HCT 41.3 07/17/2022   PLT 252.0 07/17/2022   GLUCOSE 87 07/17/2022   ALT 11 01/27/2022   AST 16 01/27/2022   NA 138 07/17/2022   K 4.3 07/17/2022   CL 102 07/17/2022   CREATININE 0.68 07/17/2022   BUN 17 07/17/2022   CO2 29 07/17/2022   TSH 2.40 07/17/2022   HGBA1C 5.3 06/20/2021    NM Sentinel Node Inj-No Rpt (Melanoma)  Result Date: 04/15/2021 Sulfur Colloid was injected by the Nuclear Medicine Technologist for sentinel lymph node localization.    Assessment & Plan:  .Palpitations Assessment & Plan: Intermittent,  rate to 140 per  her apple watch . Ikely SVT Accompanied by feeling light headed. I have ordered and reviewed a 12 lead EKG and find that there are no acute changes and patient is in sinus rhythm.   . Thyroid function , lytes ordered.    ZIO monitor ordered   Lab Results Component Value Date  TSH 2.40 07/17/2022 Lab Results  Component Value Date   NA 138 07/17/2022   K 4.3 07/17/2022   CL 102 07/17/2022   CO2 29 07/17/2022     Orders: -     EKG 12-Lead -     LONG TERM MONITOR (3-14 DAYS); Future -     TSH -     CBC with Differential/Platelet -     Basic metabolic panel -     Magnesium      Follow-up: No follow-ups on file.   Sherlene Shams, MD

## 2022-07-18 DIAGNOSIS — R002 Palpitations: Secondary | ICD-10-CM | POA: Insufficient documentation

## 2022-07-18 NOTE — Assessment & Plan Note (Addendum)
Intermittent,  rate to 140 per  her apple watch . Ikely SVT Accompanied by feeling light headed. I have ordered and reviewed a 12 lead EKG and find that there are no acute changes and patient is in sinus rhythm.   . Thyroid function , lytes ordered.    ZIO monitor ordered   Lab Results Component Value Date  TSH 2.40 07/17/2022 Lab Results  Component Value Date   NA 138 07/17/2022   K 4.3 07/17/2022   CL 102 07/17/2022   CO2 29 07/17/2022

## 2022-07-20 DIAGNOSIS — R002 Palpitations: Secondary | ICD-10-CM

## 2022-08-14 ENCOUNTER — Other Ambulatory Visit: Payer: Self-pay

## 2022-08-15 ENCOUNTER — Other Ambulatory Visit (HOSPITAL_COMMUNITY): Payer: Self-pay

## 2022-09-10 ENCOUNTER — Other Ambulatory Visit: Payer: Self-pay

## 2022-09-11 ENCOUNTER — Encounter: Payer: Self-pay | Admitting: Internal Medicine

## 2022-09-12 ENCOUNTER — Other Ambulatory Visit (HOSPITAL_COMMUNITY): Payer: Self-pay

## 2022-09-13 ENCOUNTER — Other Ambulatory Visit: Payer: Self-pay | Admitting: Internal Medicine

## 2022-09-13 ENCOUNTER — Encounter: Payer: Self-pay | Admitting: Internal Medicine

## 2022-09-15 ENCOUNTER — Other Ambulatory Visit (HOSPITAL_COMMUNITY): Payer: Self-pay

## 2022-09-30 NOTE — Telephone Encounter (Signed)
noted 

## 2022-10-13 ENCOUNTER — Other Ambulatory Visit: Payer: Self-pay

## 2022-10-28 ENCOUNTER — Ambulatory Visit: Payer: Self-pay | Admitting: Nurse Practitioner

## 2022-10-28 ENCOUNTER — Encounter: Payer: Self-pay | Admitting: Nurse Practitioner

## 2022-10-28 VITALS — BP 110/80 | HR 82 | Ht 64.0 in | Wt 179.0 lb

## 2022-10-28 DIAGNOSIS — Z789 Other specified health status: Secondary | ICD-10-CM

## 2022-10-28 NOTE — Progress Notes (Signed)
[facility]  Subjective:  Patient ID: Deborah Martin, female    DOB: 1965-03-06  Age: 57 y.o. MRN: [id number]  CC: wellness exam  HPI Deborah Martin presents for wellness exam visit for insurance benefit.  Patient has a PCP: Dr.Tullo  PMH significant for: hx of melanoma, anxiety, restless leg.  Last labs per PCP were completed: May 2024  Health Maintenance:  Colonoscopy: 2015 Mammogram: completed 2024 Pap: hx of hysterectomy     Smoker: Never   Immunizations:  Shingrix- never done  COVID-  x2 Tdap- 2027 Flu- yearly  Lifestyle: Diet- no particular diet.  Exercise- works with trainer one day a week. Water aerobics.       Past Medical History:  Diagnosis Date   Anxiety    Arthritis    lower back   Cancer (HCC)    Melanoma   Restless legs     Past Surgical History:  Procedure Laterality Date   ABDOMINAL HYSTERECTOMY     CESAREAN SECTION     EXCISION MELANOMA WITH SENTINEL LYMPH NODE BIOPSY Right 04/15/2021   Procedure: WIDE LOCAL EXCISION OF RIGHT BUTTOCK MELANOMA WITH SENTINEL LYMPH NODE BIOPSY;  Surgeon: Fritzi Mandes, MD;  Location: [facility] OR;  Service: General;  Laterality: Right;  LMA   LAPAROSCOPIC VAGINAL HYSTERECTOMY WITH SALPINGO OOPHORECTOMY Bilateral 03/06/2016   Procedure: LAPAROSCOPIC ASSISTED VAGINAL HYSTERECTOMY WITH BILATERAL SALPINGO OOPHORECTOMY;  Surgeon: Harold Hedge, MD;  Location: [facility] ORS;  Service: Gynecology;  Laterality: Bilateral;   TONSILLECTOMY      Outpatient Medications Prior to Visit  Medication Sig Dispense Refill   cetirizine (ZYRTEC) 10 MG tablet Take 10 mg by mouth daily as needed for allergies.     EC-RX Testosterone 0.4 % CREA Place onto the skin. Applied twice per week     escitalopram (LEXAPRO) 10 MG tablet Take 1 tablet (10 mg total) by mouth daily. 90 tablet 1   estradiol (ESTRACE) 1 MG tablet Take 1 tablet (1 mg total) by mouth daily. 90 tablet 3   fluticasone (FLONASE) 50 MCG/ACT nasal spray  Place 2 sprays into both nostrils daily. 16 g 2   Multiple Vitamins-Minerals (MULTIVITAMIN WITH MINERALS) tablet Take 1 tablet by mouth daily.     pramipexole (MIRAPEX) 0.25 MG tablet Take 1 tablet (0.25 mg total) by mouth at bedtime. 90 tablet 1   Semaglutide, 1 MG/DOSE, 4 MG/3ML SOPN Inject 4 mg into the skin once a week.     zolpidem (AMBIEN CR) 6.25 MG CR tablet Take 1 tablet (6.25 mg total) by mouth at bedtime. 30 tablet 5   No facility-administered medications prior to visit.    ROS Review of Systems  Constitutional:  Negative for fatigue and unexpected weight change.  HENT:  Negative for hearing loss.   Eyes:  Positive for visual disturbance (left eye visual disturbance).  Respiratory:  Negative for shortness of breath.   Cardiovascular:  Negative for chest pain and leg swelling.  Gastrointestinal:  Negative for constipation, diarrhea, nausea and vomiting.  Musculoskeletal:  Positive for arthralgias (knuckle pain).  Neurological:  Negative for dizziness and headaches.  Psychiatric/Behavioral:  Negative for sleep disturbance.     Objective:  BP 110/80   Pulse 82   Ht 5\' 4"  (1.626 m)   Wt 179 lb (81.2 kg)   LMP 01/09/2016 (Approximate)   SpO2 97%   BMI 30.73 kg/m   Physical Exam Constitutional:      General: She is not in acute distress. HENT:  Head: Normocephalic.     Right Ear: Tympanic membrane, ear canal and external ear normal.     Left Ear: Tympanic membrane, ear canal and external ear normal.     Nose: Nose normal. No congestion.     Mouth/Throat:     Mouth: Mucous membranes are moist.     Pharynx: Oropharynx is clear.  Eyes:     Pupils: Pupils are equal, round, and reactive to light.  Cardiovascular:     Rate and Rhythm: Normal rate and regular rhythm.     Heart sounds: Normal heart sounds.  Pulmonary:     Effort: Pulmonary effort is normal.     Breath sounds: Normal breath sounds.  Abdominal:     General: Bowel sounds are normal.     Palpations:  Abdomen is soft.  Musculoskeletal:        General: Normal range of motion.     Right lower leg: No edema.     Left lower leg: No edema.  Skin:    General: Skin is warm.  Neurological:     General: No focal deficit present.     Mental Status: She is alert and oriented to person, place, and time.     Gait: Gait normal.  Psychiatric:        Mood and Affect: Mood normal.        Behavior: Behavior normal.      Assessment & Plan:    [name] was seen today for wellness exam.  Diagnoses and all orders for this visit:  Participant in health and wellness plan Adult wellness physical was conducted today. Importance of diet and exercise were discussed in detail.  We reviewed immunizations and gave recommendations regarding current immunization needed for age. Patient would like Shingrix vaccine. Plan to schedule for later this month.  Preventative health exams are up to date.   Patient was advised yearly wellness exam and follow-up with PCP as scheduled.     No orders of the defined types were placed in this encounter.   No orders of the defined types were placed in this encounter.   Follow-up: as needed.

## 2022-10-29 ENCOUNTER — Other Ambulatory Visit (HOSPITAL_COMMUNITY): Payer: Self-pay

## 2022-11-09 ENCOUNTER — Other Ambulatory Visit: Payer: Self-pay | Admitting: Internal Medicine

## 2022-11-11 ENCOUNTER — Other Ambulatory Visit (HOSPITAL_COMMUNITY): Payer: Self-pay

## 2022-11-11 ENCOUNTER — Other Ambulatory Visit: Payer: Self-pay

## 2022-11-11 MED ORDER — ZOLPIDEM TARTRATE ER 6.25 MG PO TBCR
6.2500 mg | EXTENDED_RELEASE_TABLET | Freq: Every day | ORAL | 5 refills | Status: DC
  Filled 2022-11-11: qty 30, 30d supply, fill #0
  Filled 2022-12-12: qty 30, 30d supply, fill #1
  Filled 2023-01-12: qty 30, 30d supply, fill #2
  Filled 2023-02-11: qty 30, 30d supply, fill #3
  Filled 2023-03-16: qty 30, 30d supply, fill #4
  Filled 2023-04-14: qty 30, 30d supply, fill #5

## 2022-12-05 ENCOUNTER — Other Ambulatory Visit (HOSPITAL_COMMUNITY): Payer: Self-pay

## 2022-12-05 MED ORDER — NEOMYCIN-POLYMYXIN-DEXAMETH 3.5-10000-0.1 OP SUSP
1.0000 [drp] | OPHTHALMIC | 0 refills | Status: DC
  Filled 2022-12-05: qty 5, 17d supply, fill #0

## 2022-12-09 ENCOUNTER — Ambulatory Visit: Payer: Self-pay | Admitting: Nurse Practitioner

## 2022-12-11 ENCOUNTER — Ambulatory Visit: Payer: Self-pay | Admitting: Nurse Practitioner

## 2022-12-11 NOTE — Progress Notes (Signed)
1st Dose of Shingrix given today. CDC VIS given prior to injection.  Tolerated well. No immediate adverse reactions noted. Educated and discussed red flag symptoms and to call 911 or go to emergency room with signs of anaphylaxis.  Take OTC tylenol as needed for expected side effects such as muscle aches or fever.  Patient given the chance to ask all questions and discussed answers.  RTC in 2 months for 2nd dose of shingrix vaccine to complete series, or sooner as needed.

## 2022-12-22 ENCOUNTER — Other Ambulatory Visit: Payer: Self-pay | Admitting: Internal Medicine

## 2022-12-23 ENCOUNTER — Other Ambulatory Visit (HOSPITAL_COMMUNITY): Payer: Self-pay

## 2022-12-23 ENCOUNTER — Other Ambulatory Visit: Payer: Self-pay

## 2022-12-23 MED ORDER — PRAMIPEXOLE DIHYDROCHLORIDE 0.25 MG PO TABS
0.2500 mg | ORAL_TABLET | Freq: Every evening | ORAL | 1 refills | Status: DC
  Filled 2022-12-23: qty 30, 30d supply, fill #0
  Filled 2023-01-18: qty 30, 30d supply, fill #1

## 2023-01-12 ENCOUNTER — Other Ambulatory Visit (HOSPITAL_COMMUNITY): Payer: Self-pay

## 2023-01-12 ENCOUNTER — Other Ambulatory Visit: Payer: Self-pay

## 2023-01-15 ENCOUNTER — Other Ambulatory Visit (HOSPITAL_COMMUNITY): Payer: Self-pay

## 2023-01-18 ENCOUNTER — Encounter: Payer: Self-pay | Admitting: Internal Medicine

## 2023-01-19 ENCOUNTER — Other Ambulatory Visit (HOSPITAL_COMMUNITY): Payer: Self-pay

## 2023-01-19 MED ORDER — ESCITALOPRAM OXALATE 10 MG PO TABS
10.0000 mg | ORAL_TABLET | Freq: Every day | ORAL | 0 refills | Status: DC
  Filled 2023-01-19: qty 30, 30d supply, fill #0
  Filled 2023-02-14: qty 30, 30d supply, fill #1
  Filled 2023-03-24: qty 30, 30d supply, fill #2

## 2023-01-20 ENCOUNTER — Other Ambulatory Visit (HOSPITAL_COMMUNITY): Payer: Self-pay

## 2023-02-03 ENCOUNTER — Other Ambulatory Visit (HOSPITAL_COMMUNITY): Payer: Self-pay

## 2023-02-03 MED ORDER — PREDNISOLONE ACETATE 1 % OP SUSP
OPHTHALMIC | 0 refills | Status: AC
  Filled 2023-02-03: qty 5, 14d supply, fill #0

## 2023-02-11 ENCOUNTER — Other Ambulatory Visit: Payer: Self-pay | Admitting: Internal Medicine

## 2023-02-12 ENCOUNTER — Other Ambulatory Visit (HOSPITAL_COMMUNITY): Payer: Self-pay

## 2023-02-12 MED ORDER — PRAMIPEXOLE DIHYDROCHLORIDE 0.25 MG PO TABS
0.2500 mg | ORAL_TABLET | Freq: Every evening | ORAL | 1 refills | Status: DC
  Filled 2023-02-12: qty 30, 30d supply, fill #0
  Filled 2023-03-10: qty 30, 30d supply, fill #1

## 2023-03-06 ENCOUNTER — Other Ambulatory Visit (HOSPITAL_COMMUNITY): Payer: Self-pay

## 2023-03-11 ENCOUNTER — Other Ambulatory Visit (HOSPITAL_COMMUNITY): Payer: Self-pay

## 2023-03-11 ENCOUNTER — Encounter: Payer: Self-pay | Admitting: Internal Medicine

## 2023-03-11 DIAGNOSIS — G2581 Restless legs syndrome: Secondary | ICD-10-CM

## 2023-03-11 MED ORDER — AMOXICILLIN 500 MG PO CAPS
1000.0000 mg | ORAL_CAPSULE | ORAL | 1 refills | Status: DC
  Filled 2023-03-11: qty 21, 7d supply, fill #0

## 2023-03-12 MED ORDER — PRAMIPEXOLE DIHYDROCHLORIDE 0.5 MG PO TABS
0.5000 mg | ORAL_TABLET | Freq: Every evening | ORAL | 2 refills | Status: DC
  Filled 2023-03-12: qty 30, 30d supply, fill #0
  Filled 2023-04-14: qty 30, 30d supply, fill #1
  Filled 2023-05-14: qty 30, 30d supply, fill #2
  Filled 2023-05-15: qty 30, 30d supply, fill #0

## 2023-03-13 ENCOUNTER — Other Ambulatory Visit (HOSPITAL_COMMUNITY): Payer: Self-pay

## 2023-03-16 ENCOUNTER — Other Ambulatory Visit (HOSPITAL_COMMUNITY): Payer: Self-pay

## 2023-03-16 ENCOUNTER — Other Ambulatory Visit: Payer: Self-pay

## 2023-03-19 ENCOUNTER — Ambulatory Visit: Payer: Self-pay | Admitting: Nurse Practitioner

## 2023-03-19 NOTE — Progress Notes (Signed)
2nd Dose of Shingrix given today. Series complete.  Tolerated well. No immediate adverse reactions noted. Educated and discussed red flag symptoms and to call 911 or go to emergency room with signs of anaphylaxis.  Take OTC tylenol as needed for expected side effects such as muscle aches or fever.  Patient given the chance to ask all questions and discussed answers.  Return to clinic as needed.

## 2023-04-14 ENCOUNTER — Other Ambulatory Visit: Payer: Self-pay

## 2023-04-16 ENCOUNTER — Encounter: Payer: Self-pay | Admitting: Nurse Practitioner

## 2023-04-16 ENCOUNTER — Ambulatory Visit: Payer: Self-pay | Admitting: Nurse Practitioner

## 2023-04-16 VITALS — BP 119/76 | HR 77 | Temp 99.1°F

## 2023-04-16 DIAGNOSIS — J029 Acute pharyngitis, unspecified: Secondary | ICD-10-CM

## 2023-04-16 LAB — POC COVID19/FLU A&B COMBO
Covid Antigen, POC: NEGATIVE
Influenza A Antigen, POC: NEGATIVE
Influenza B Antigen, POC: NEGATIVE

## 2023-04-16 NOTE — Progress Notes (Signed)
Acute Office Visit  Subjective:     Patient ID: Deborah Martin, female    DOB: 1965-07-19, 58 y.o.   MRN: [id number]  Chief Complaint  Patient presents with   Sore Throat    HPI Patient is in today for sore throat which started yesterday. States throat is burning. Feel some drainage in the back of her throat.  Denies fevers or chills. No other symptoms. States she took zyrtec and tylenol earlier today. Also switching between cold and hot liquids.  She is leaving for a trip over the weekend and wants to make sure she is well enough for travel.   Review of Systems  Constitutional:  Positive for malaise/fatigue. Negative for chills and fever.  HENT:  Positive for sore throat. Negative for congestion, ear pain, sinus pain and tinnitus.   Eyes: Negative.   Respiratory:  Negative for cough, sputum production and shortness of breath.   Cardiovascular:  Negative for chest pain.  Gastrointestinal: Negative.   Musculoskeletal:  Negative for myalgias.  Neurological:  Negative for headaches.        Objective:    BP 119/76 (BP Location: Right Arm, Cuff Size: Normal)   Pulse 77   Temp 99.1 F (37.3 C)   LMP 01/09/2016 (Approximate)   SpO2 97%    Physical Exam Constitutional:      General: She is not in acute distress. HENT:     Head: Normocephalic.     Right Ear: Tympanic membrane, ear canal and external ear normal.     Left Ear: Tympanic membrane, ear canal and external ear normal.     Nose: No congestion or rhinorrhea.     Mouth/Throat:     Mouth: Mucous membranes are moist.     Pharynx: Posterior oropharyngeal erythema present. No oropharyngeal exudate.  Eyes:     Pupils: Pupils are equal, round, and reactive to light.  Cardiovascular:     Rate and Rhythm: Normal rate and regular rhythm.     Heart sounds: Normal heart sounds.  Pulmonary:     Effort: Pulmonary effort is normal.     Breath sounds: Normal breath sounds.  Musculoskeletal:        General: Normal  range of motion.  Lymphadenopathy:     Cervical: No cervical adenopathy.  Skin:    General: Skin is warm.  Neurological:     General: No focal deficit present.     Mental Status: She is alert and oriented to person, place, and time.  Psychiatric:        Mood and Affect: Mood normal.        Behavior: Behavior normal.     Results for orders placed or performed in visit on 04/16/23  POC Covid19/Flu A&B Antigen  Result Value Ref Range   Influenza A Antigen, POC Negative Negative   Influenza B Antigen, POC Negative Negative   Covid Antigen, POC Negative Negative        Assessment & Plan:   Problem List Items Addressed This Visit   None Visit Diagnoses       Pharyngitis, unspecified etiology    -  Primary   Relevant Orders   POC Covid19/Flu A&B Antigen (Completed)     Tested negative for flu and covid. Unfortunately no strep tests available in clinic today but low concerns for strep based on physical exam. Encouraged supportive care measures including salt water gargles, otc sore throat remedies and use of humidifier. Reviewed red flags symptoms and when to seek care.  No orders of the defined types were placed in this encounter.   As needed.   Gloris Ham, NP

## 2023-04-27 ENCOUNTER — Other Ambulatory Visit (HOSPITAL_COMMUNITY): Payer: Self-pay

## 2023-04-27 ENCOUNTER — Other Ambulatory Visit: Payer: Self-pay | Admitting: Internal Medicine

## 2023-04-27 MED ORDER — ESCITALOPRAM OXALATE 10 MG PO TABS
10.0000 mg | ORAL_TABLET | Freq: Every day | ORAL | 0 refills | Status: DC
  Filled 2023-04-27: qty 30, 30d supply, fill #0

## 2023-05-14 ENCOUNTER — Other Ambulatory Visit: Payer: Self-pay

## 2023-05-15 ENCOUNTER — Other Ambulatory Visit: Payer: Self-pay

## 2023-05-15 ENCOUNTER — Other Ambulatory Visit (HOSPITAL_COMMUNITY): Payer: Self-pay

## 2023-05-17 ENCOUNTER — Other Ambulatory Visit: Payer: Self-pay | Admitting: Internal Medicine

## 2023-05-18 ENCOUNTER — Other Ambulatory Visit (HOSPITAL_COMMUNITY): Payer: Self-pay

## 2023-05-18 MED ORDER — ZOLPIDEM TARTRATE ER 6.25 MG PO TBCR
6.2500 mg | EXTENDED_RELEASE_TABLET | Freq: Every day | ORAL | 5 refills | Status: AC
  Filled 2023-05-18: qty 30, 30d supply, fill #0
  Filled 2023-06-12 – 2023-06-16 (×2): qty 30, 30d supply, fill #1
  Filled 2023-07-16: qty 30, 30d supply, fill #2

## 2023-05-19 ENCOUNTER — Other Ambulatory Visit (HOSPITAL_COMMUNITY): Payer: Self-pay

## 2023-05-22 ENCOUNTER — Ambulatory Visit (INDEPENDENT_AMBULATORY_CARE_PROVIDER_SITE_OTHER): Payer: PRIVATE HEALTH INSURANCE | Admitting: Internal Medicine

## 2023-05-22 ENCOUNTER — Other Ambulatory Visit (HOSPITAL_COMMUNITY): Payer: Self-pay

## 2023-05-22 ENCOUNTER — Encounter: Payer: Self-pay | Admitting: Internal Medicine

## 2023-05-22 VITALS — BP 104/66 | HR 75 | Ht 64.0 in | Wt 192.8 lb

## 2023-05-22 DIAGNOSIS — G2581 Restless legs syndrome: Secondary | ICD-10-CM | POA: Diagnosis not present

## 2023-05-22 DIAGNOSIS — Z1211 Encounter for screening for malignant neoplasm of colon: Secondary | ICD-10-CM

## 2023-05-22 MED ORDER — PRAMIPEXOLE DIHYDROCHLORIDE 0.5 MG PO TABS
0.5000 mg | ORAL_TABLET | Freq: Every evening | ORAL | 2 refills | Status: DC
  Filled 2023-05-22 – 2023-06-12 (×2): qty 30, 30d supply, fill #0
  Filled 2023-07-20: qty 30, 30d supply, fill #1
  Filled 2023-08-16: qty 30, 30d supply, fill #2

## 2023-05-22 MED ORDER — ESTRADIOL 1 MG PO TABS
1.0000 mg | ORAL_TABLET | Freq: Every day | ORAL | 3 refills | Status: DC
  Filled 2023-05-22: qty 90, 90d supply, fill #0
  Filled 2023-06-04: qty 30, 30d supply, fill #0
  Filled 2023-07-06: qty 30, 30d supply, fill #1
  Filled 2023-08-03: qty 30, 30d supply, fill #2
  Filled 2023-08-31: qty 30, 30d supply, fill #3

## 2023-05-22 MED ORDER — ESCITALOPRAM OXALATE 10 MG PO TABS
10.0000 mg | ORAL_TABLET | Freq: Every day | ORAL | 0 refills | Status: DC
Start: 2023-05-22 — End: 2023-08-25
  Filled 2023-05-22: qty 30, 30d supply, fill #0
  Filled 2023-06-24: qty 30, 30d supply, fill #1
  Filled 2023-07-24: qty 30, 30d supply, fill #2

## 2023-05-22 NOTE — Patient Instructions (Addendum)
 Everything has been refilled.   You might want to try using Relaxium for insomnia  as an alternative to [place].  It is available through [employer] and contains all natural supplements:  Melatonin 5 mg  Chamomile 25 mg Passionflower extract 75 mg GABA 100 mg Ashwaganda extract 125 mg Magnesium citrate, glycinate, oxide (100 mg)  L tryptophan 500 mg Valerest (proprietary  ingredient ; probably valeria root extract    get your labs done at [facility]

## 2023-05-22 NOTE — Progress Notes (Signed)
 Subjective:  Patient ID: Deborah Martin, female    DOB: June 17, 1965  Age: 58 y.o. MRN: [id number]  CC: The primary encounter diagnosis was Colon cancer screening. A diagnosis of Restless legs syndrome was also pertinent to this visit.   HPI Deborah Martin presents for  Chief Complaint  Patient presents with   Medical Management of Chronic Issues   1) menopause: feeling better,  improved with testosterone supplementation   2)  Insomnia:  Patient  has been having some trouble sleeping.  Wakes up 2 to 3 times per night . Bedtime hygiene reviewed,  Patient has been using an electronic book to read before bed.  Does not drink caffeinated beverages after 3 PM.  Only voids  bladder once per night.  No snoring partner. Does not drink alcohol to excess.  Not exercising excessively in the evening. Patient does have a history of anxiety but does not lie awake worrying about issues that cannot be resolved. Does not take stimulants. .   3) obesity:  has gain nearly 20 lbs since her insurance stopped paying for GLP 1 agonist.    Outpatient Medications Prior to Visit  Medication Sig Dispense Refill   cetirizine (ZYRTEC) 10 MG tablet Take 10 mg by mouth daily as needed for allergies.     EC-RX Testosterone 0.4 % CREA Place onto the skin. Applied twice per week     fluticasone (FLONASE) 50 MCG/ACT nasal spray Place 2 sprays into both nostrils daily. (Patient taking differently: Place 2 sprays into both nostrils daily as needed.) 16 g 2   Multiple Vitamins-Minerals (MULTIVITAMIN WITH MINERALS) tablet Take 1 tablet by mouth daily.     Semaglutide, 1 MG/DOSE, 4 MG/3ML SOPN Inject 2.4 mg into the skin once a week.     zolpidem (AMBIEN CR) 6.25 MG CR tablet Take 1 tablet (6.25 mg total) by mouth at bedtime. 30 tablet 5   escitalopram (LEXAPRO) 10 MG tablet Take 1 tablet (10 mg total) by mouth daily. 90 tablet 0   estradiol (ESTRACE) 1 MG tablet Take 1 tablet (1 mg total) by mouth daily. 90 tablet 3    pramipexole (MIRAPEX) 0.5 MG tablet Take 1 tablet (0.5 mg total) by mouth at bedtime. 30 tablet 2   No facility-administered medications prior to visit.    Review of Systems;  Patient denies headache, fevers, malaise, unintentional weight loss, skin rash, eye pain, sinus congestion and sinus pain, sore throat, dysphagia,  hemoptysis , cough, dyspnea, wheezing, chest pain, palpitations, orthopnea, edema, abdominal pain, nausea, melena, diarrhea, constipation, flank pain, dysuria, hematuria, urinary  Frequency, nocturia, numbness, tingling, seizures,  Focal weakness, Loss of consciousness,  Tremor, insomnia, depression, anxiety, and suicidal ideation.      Objective:  BP 104/66   Pulse 75   Ht 5\' 4"  (1.626 m)   Wt 192 lb 12.8 oz (87.5 kg)   LMP 01/09/2016 (Approximate)   SpO2 98%   BMI 33.09 kg/m   BP Readings from Last 3 Encounters:  05/22/23 104/66  04/16/23 119/76  10/28/22 110/80    Wt Readings from Last 3 Encounters:  05/22/23 192 lb 12.8 oz (87.5 kg)  10/28/22 179 lb (81.2 kg)  07/17/22 178 lb (80.7 kg)    Physical Exam Vitals reviewed.  Constitutional:      General: She is not in acute distress.    Appearance: Normal appearance. She is normal weight. She is not ill-appearing, toxic-appearing or diaphoretic.  HENT:     Head: Normocephalic.  Eyes:  General: No scleral icterus.       Right eye: No discharge.        Left eye: No discharge.     Conjunctiva/sclera: Conjunctivae normal.  Cardiovascular:     Rate and Rhythm: Normal rate and regular rhythm.     Heart sounds: Normal heart sounds.  Pulmonary:     Effort: Pulmonary effort is normal. No respiratory distress.     Breath sounds: Normal breath sounds.  Musculoskeletal:        General: Normal range of motion.  Skin:    General: Skin is warm and dry.  Neurological:     General: No focal deficit present.     Mental Status: She is alert and oriented to person, place, and time. Mental status is at  baseline.  Psychiatric:        Mood and Affect: Mood normal.        Behavior: Behavior normal.        Thought Content: Thought content normal.        Judgment: Judgment normal.    Lab Results  Component Value Date   HGBA1C 5.3 06/20/2021    Lab Results  Component Value Date   CREATININE 0.68 07/17/2022   CREATININE 0.64 01/27/2022   CREATININE 0.7 11/20/2020    Lab Results  Component Value Date   WBC 7.7 07/17/2022   HGB 14.0 07/17/2022   HCT 41.3 07/17/2022   PLT 252.0 07/17/2022   GLUCOSE 87 07/17/2022   ALT 11 01/27/2022   AST 16 01/27/2022   NA 138 07/17/2022   K 4.3 07/17/2022   CL 102 07/17/2022   CREATININE 0.68 07/17/2022   BUN 17 07/17/2022   CO2 29 07/17/2022   TSH 2.40 07/17/2022   HGBA1C 5.3 06/20/2021    NM Sentinel Node Inj-No Rpt (Melanoma) Result Date: 04/15/2021 Sulfur Colloid was injected by the Nuclear Medicine Technologist for sentinel lymph node localization.    Assessment & Plan:  .Colon cancer screening -     Cologuard  Restless legs syndrome Assessment & Plan: suboptimal control on lowest dose of mirapex.  Symptoms start in early evening,  improved with 0.25  mg.  Needs iron studies    Other orders -     Escitalopram Oxalate; Take 1 tablet (10 mg total) by mouth daily.  Dispense: 90 tablet; Refill: 0 -     Estradiol; Take 1 tablet (1 mg total) by mouth daily.  Dispense: 90 tablet; Refill: 3 -     Pramipexole Dihydrochloride; Take 1 tablet (0.5 mg total) by mouth at bedtime.  Dispense: 30 tablet; Refill: 2     Follow-up: No follow-ups on file.   Sherlene Shams, MD

## 2023-05-24 NOTE — Assessment & Plan Note (Signed)
 suboptimal control on lowest dose of mirapex.  Symptoms start in early evening,  improved with 0.25  mg.  Needs iron studies

## 2023-06-05 ENCOUNTER — Other Ambulatory Visit (HOSPITAL_COMMUNITY): Payer: Self-pay

## 2023-06-08 LAB — COLOGUARD: COLOGUARD: NEGATIVE

## 2023-06-09 ENCOUNTER — Other Ambulatory Visit (HOSPITAL_COMMUNITY): Payer: Self-pay

## 2023-06-09 ENCOUNTER — Ambulatory Visit: Payer: PRIVATE HEALTH INSURANCE | Admitting: Nurse Practitioner

## 2023-06-09 ENCOUNTER — Encounter: Payer: Self-pay | Admitting: Nurse Practitioner

## 2023-06-09 ENCOUNTER — Encounter: Payer: Self-pay | Admitting: Internal Medicine

## 2023-06-09 VITALS — BP 116/70 | HR 82 | Temp 97.4°F

## 2023-06-09 DIAGNOSIS — H6593 Unspecified nonsuppurative otitis media, bilateral: Secondary | ICD-10-CM

## 2023-06-09 MED ORDER — CIPROFLOXACIN-DEXAMETHASONE 0.3-0.1 % OT SUSP
4.0000 [drp] | Freq: Two times a day (BID) | OTIC | 0 refills | Status: DC
  Filled 2023-06-09: qty 7.5, 19d supply, fill #0

## 2023-06-09 NOTE — Progress Notes (Signed)
 Acute Office Visit  Subjective:     Patient ID: Deborah Martin, female    DOB: [birth year], 58 y.o.   MRN: [id number]  Chief Complaint  Patient presents with   Ear Pain    HPI Patient is in today for c/o ear pain to right ear. Pain started Friday night. Describes as a dull ache but ocassionally sharp Has taken tylenol for pain. Has seasonal allergies- with post-nasal drip and some congestion. Not using flonase currently.  Will be going out of town this weekend.   Review of Systems  Constitutional:  Negative for chills and fever.  HENT:  Positive for congestion and ear pain. Negative for ear discharge, sinus pain, sore throat and tinnitus.   Respiratory:  Negative for cough, sputum production and shortness of breath.   Cardiovascular:  Negative for chest pain.        Objective:    BP 116/70 (BP Location: Left Arm, Patient Position: Sitting, Cuff Size: Normal)   Pulse 82   Temp (!) 97.4 F (36.3 C)   LMP 01/09/2016 (Approximate)   SpO2 98%    Physical Exam Constitutional:      General: She is not in acute distress. HENT:     Head: Normocephalic.     Jaw: No trismus or swelling.     Right Ear: A middle ear effusion is present. Tympanic membrane is not injected or erythematous.     Left Ear: A middle ear effusion is present. Tympanic membrane is not injected or erythematous.     Nose: Nose normal.     Mouth/Throat:     Mouth: Mucous membranes are moist.     Pharynx: No oropharyngeal exudate or posterior oropharyngeal erythema.  Eyes:     Pupils: Pupils are equal, round, and reactive to light.  Pulmonary:     Effort: Pulmonary effort is normal.  Neurological:     General: No focal deficit present.     Mental Status: She is alert and oriented to person, place, and time.     No results found for any visits on 06/09/23.      Assessment & Plan:   Problem List Items Addressed This Visit   None Visit Diagnoses       OME (otitis media with effusion),  bilateral    -  Primary   Relevant Medications   ciprofloxacin-dexamethasone (CIPRODEX) OTIC suspension  Recommended use of flonase over the next few days. If no improvement, can start cipro-dex which has been sent to the pharmacy.       Meds ordered this encounter  Medications   ciprofloxacin-dexamethasone (CIPRODEX) OTIC suspension    Sig: Place 4 drops into the right ear 2 (two) times daily.    Dispense:  7.5 mL    Refill:  0    Supervising Provider:   BACIGALUPO, ANGELA M [[id number]]    As needed.   Patrycja Mumpower Flores Eliu Batch, NP

## 2023-06-12 ENCOUNTER — Other Ambulatory Visit (HOSPITAL_COMMUNITY): Payer: Self-pay

## 2023-06-12 ENCOUNTER — Other Ambulatory Visit: Payer: Self-pay | Admitting: Nurse Practitioner

## 2023-06-12 ENCOUNTER — Other Ambulatory Visit: Payer: Self-pay

## 2023-06-12 DIAGNOSIS — J069 Acute upper respiratory infection, unspecified: Secondary | ICD-10-CM

## 2023-06-16 ENCOUNTER — Other Ambulatory Visit: Payer: Self-pay

## 2023-06-16 ENCOUNTER — Other Ambulatory Visit (HOSPITAL_COMMUNITY): Payer: Self-pay

## 2023-06-16 MED ORDER — FLUTICASONE PROPIONATE 50 MCG/ACT NA SUSP
2.0000 | Freq: Every day | NASAL | 2 refills | Status: AC
  Filled 2023-06-16: qty 16, 30d supply, fill #0

## 2023-06-18 ENCOUNTER — Ambulatory Visit: Payer: PRIVATE HEALTH INSURANCE | Admitting: Nurse Practitioner

## 2023-06-18 ENCOUNTER — Encounter: Payer: Self-pay | Admitting: Nurse Practitioner

## 2023-06-18 VITALS — BP 118/78 | HR 75

## 2023-06-18 DIAGNOSIS — Z789 Other specified health status: Secondary | ICD-10-CM

## 2023-06-18 DIAGNOSIS — H6593 Unspecified nonsuppurative otitis media, bilateral: Secondary | ICD-10-CM

## 2023-06-18 NOTE — Progress Notes (Signed)
 Occupational [facility]- [facility]  Subjective:  Patient ID: Deborah Martin, female    DOB: 08/20/1965  Age: 58 y.o. MRN: [id number]  CC: Wellness Exam  HPI Deborah Martin presents for wellness exam visit for insurance benefit.  Patient has a PCP: Dr.Tullo PMH significant for: anxiety, restless leg,  Last labs per PCP were completed: May 2024  Health Maintenance:  Colonoscopy: Cologuard April 2025, repeat in 3 years. Mammogram: April 2023, schedule in May 2025 Pap: hx of hysterectomy.    Smoker:never  Immunizations:  Shingrix- completed  2024 Tdap: June 2022 Flu- declines   Lifestyle: Diet- No particular diet, was on semaglutide  but has been off x1 month   Right Ear still hurts some, has been using ear drops and flonase . Reports it is better today. Takes zyrtec occasionally.   Past Medical History:  Diagnosis Date   Anxiety    Arthritis    lower back   Cancer (HCC)    Melanoma   Restless legs     Past Surgical History:  Procedure Laterality Date   ABDOMINAL HYSTERECTOMY     CESAREAN SECTION     EXCISION MELANOMA WITH SENTINEL LYMPH NODE BIOPSY Right 04/15/2021   Procedure: WIDE LOCAL EXCISION OF RIGHT BUTTOCK MELANOMA WITH SENTINEL LYMPH NODE BIOPSY;  Surgeon: Lujean Sake, MD;  Location: [facility] OR;  Service: General;  Laterality: Right;  LMA   LAPAROSCOPIC VAGINAL HYSTERECTOMY WITH SALPINGO OOPHORECTOMY Bilateral 03/06/2016   Procedure: LAPAROSCOPIC ASSISTED VAGINAL HYSTERECTOMY WITH BILATERAL SALPINGO OOPHORECTOMY;  Surgeon: Thora Flint, MD;  Location: [facility];  Service: Gynecology;  Laterality: Bilateral;   TONSILLECTOMY      Outpatient Medications Prior to Visit  Medication Sig Dispense Refill   cetirizine (ZYRTEC) 10 MG tablet Take 10 mg by mouth daily as needed for allergies.     ciprofloxacin -dexamethasone  (CIPRODEX ) OTIC suspension Place 4 drops into the right ear 2 (two) times daily. 7.5 mL 0   escitalopram  (LEXAPRO ) 10 MG tablet Take 1 tablet (10 mg  total) by mouth daily. 90 tablet 0   estradiol  (ESTRACE ) 1 MG tablet Take 1 tablet (1 mg total) by mouth daily. 90 tablet 3   fluticasone  (FLONASE ) 50 MCG/ACT nasal spray Place 2 sprays into both nostrils daily. 16 g 2   Multiple Vitamins-Minerals (MULTIVITAMIN WITH MINERALS) tablet Take 1 tablet by mouth daily.     pramipexole  (MIRAPEX ) 0.5 MG tablet Take 1 tablet (0.5 mg total) by mouth at bedtime. 30 tablet 2   zolpidem  (AMBIEN  CR) 6.25 MG CR tablet Take 1 tablet (6.25 mg total) by mouth at bedtime. 30 tablet 5   EC-RX Testosterone  0.4 % CREA Place onto the skin. Applied twice per week     Semaglutide , 1 MG/DOSE, 4 MG/3ML SOPN Inject 2.4 mg into the skin once a week. (Patient not taking: Reported on 06/18/2023)     No facility-administered medications prior to visit.    ROS Review of Systems  HENT:  Negative for hearing loss.   Eyes:  Negative for visual disturbance.  Respiratory:  Negative for shortness of breath.   Cardiovascular:  Negative for chest pain.  Gastrointestinal:  Negative for constipation, diarrhea and nausea.  Musculoskeletal:  Negative for arthralgias and back pain.  Neurological:  Negative for dizziness and headaches.  Psychiatric/Behavioral:  Negative for sleep disturbance.     Objective:  BP 118/78 (BP Location: Right Arm, Patient Position: Sitting, Cuff Size: Normal)   Pulse 75   LMP 01/09/2016 (Approximate)   SpO2 97%  Physical Exam Constitutional:      General: She is not in acute distress. HENT:     Head: Normocephalic.     Right Ear: Hearing, ear canal and external ear normal. A middle ear effusion is present.     Left Ear: Hearing, ear canal and external ear normal. A middle ear effusion is present.  Eyes:     Pupils: Pupils are equal, round, and reactive to light.  Cardiovascular:     Rate and Rhythm: Normal rate and regular rhythm.     Heart sounds: Normal heart sounds.  Pulmonary:     Effort: Pulmonary effort is normal.     Breath sounds:  Normal breath sounds.  Abdominal:     Palpations: Abdomen is soft.  Musculoskeletal:     Right lower leg: No edema.     Left lower leg: No edema.  Neurological:     General: No focal deficit present.     Mental Status: She is alert and oriented to person, place, and time.  Psychiatric:        Mood and Affect: Mood normal.        Behavior: Behavior normal.      Assessment & Plan:    Deborah Martin was seen today for wellness exam.  Diagnoses and all orders for this visit:  Participant in health and wellness plan Adult wellness physical was conducted today. Importance of diet and exercise were discussed in detail.  We reviewed immunizations and gave recommendations regarding current immunization needed for age.  Preventative health exams needed: mammogram which is scheduled.   Patient was advised yearly wellness exam and follow-up with PCP as scheduled.   OME (otitis media with effusion), bilateral No signs of infection at this time. Can stop cipro -dex drops. Recommended use of zytrec daily along with mucinex to help improved fluid behind ear. Can continue using flonase . Follow-up if symptoms fail to improve.    No orders of the defined types were placed in this encounter.   No orders of the defined types were placed in this encounter.   Follow-up: as needed.

## 2023-07-15 ENCOUNTER — Other Ambulatory Visit (HOSPITAL_COMMUNITY): Payer: Self-pay

## 2023-07-15 MED ORDER — ZOLPIDEM TARTRATE ER 6.25 MG PO TBCR
6.2500 mg | EXTENDED_RELEASE_TABLET | Freq: Every evening | ORAL | 2 refills | Status: AC | PRN
  Filled 2023-07-16: qty 30, 30d supply, fill #0
  Filled 2023-08-16: qty 30, 30d supply, fill #1
  Filled 2023-09-15: qty 30, 30d supply, fill #2

## 2023-07-15 MED ORDER — ESTRADIOL 1 MG PO TABS
1.0000 mg | ORAL_TABLET | Freq: Every day | ORAL | 11 refills | Status: AC
  Filled 2023-10-05: qty 30, 30d supply, fill #0
  Filled 2023-11-06: qty 30, 30d supply, fill #1
  Filled 2023-11-30: qty 30, 30d supply, fill #2
  Filled 2024-01-05: qty 30, 30d supply, fill #3
  Filled 2024-02-29: qty 30, 30d supply, fill #4
  Filled 2024-04-01: qty 30, 30d supply, fill #5

## 2023-07-16 ENCOUNTER — Other Ambulatory Visit: Payer: Self-pay

## 2023-07-16 ENCOUNTER — Other Ambulatory Visit (HOSPITAL_COMMUNITY): Payer: Self-pay

## 2023-07-16 MED ORDER — CHOLECALCIFEROL 1.25 MG (50000 UT) PO CAPS
50000.0000 [IU] | ORAL_CAPSULE | ORAL | 0 refills | Status: DC
Start: 2023-07-16 — End: 2023-09-29
  Filled 2023-07-16: qty 8, 56d supply, fill #0

## 2023-07-16 MED ORDER — METRONIDAZOLE 0.75 % EX CREA
1.0000 | TOPICAL_CREAM | Freq: Two times a day (BID) | CUTANEOUS | 3 refills | Status: AC
  Filled 2023-07-16: qty 45, 30d supply, fill #0

## 2023-08-04 ENCOUNTER — Other Ambulatory Visit (HOSPITAL_COMMUNITY): Payer: Self-pay

## 2023-08-17 ENCOUNTER — Other Ambulatory Visit: Payer: Self-pay

## 2023-08-24 ENCOUNTER — Encounter: Payer: Self-pay | Admitting: Internal Medicine

## 2023-08-25 ENCOUNTER — Other Ambulatory Visit: Payer: Self-pay | Admitting: Internal Medicine

## 2023-08-26 ENCOUNTER — Other Ambulatory Visit (HOSPITAL_COMMUNITY): Payer: Self-pay

## 2023-08-27 ENCOUNTER — Other Ambulatory Visit (HOSPITAL_COMMUNITY): Payer: Self-pay

## 2023-08-27 MED ORDER — ESCITALOPRAM OXALATE 10 MG PO TABS
10.0000 mg | ORAL_TABLET | Freq: Every day | ORAL | 0 refills | Status: AC
  Filled 2023-08-27: qty 30, 30d supply, fill #0
  Filled 2023-10-05: qty 30, 30d supply, fill #1
  Filled 2024-02-29: qty 30, 30d supply, fill #2

## 2023-09-15 ENCOUNTER — Other Ambulatory Visit: Payer: Self-pay | Admitting: Internal Medicine

## 2023-09-16 ENCOUNTER — Other Ambulatory Visit: Payer: Self-pay

## 2023-09-17 ENCOUNTER — Other Ambulatory Visit: Payer: Self-pay | Admitting: Internal Medicine

## 2023-09-17 ENCOUNTER — Other Ambulatory Visit (HOSPITAL_COMMUNITY): Payer: Self-pay

## 2023-09-18 ENCOUNTER — Other Ambulatory Visit (HOSPITAL_COMMUNITY): Payer: Self-pay

## 2023-09-18 MED ORDER — PRAMIPEXOLE DIHYDROCHLORIDE 0.5 MG PO TABS
0.5000 mg | ORAL_TABLET | Freq: Every evening | ORAL | 5 refills | Status: DC
  Filled 2023-09-18: qty 30, 30d supply, fill #0
  Filled 2023-10-15: qty 30, 30d supply, fill #1
  Filled 2023-11-06: qty 30, 30d supply, fill #2
  Filled 2023-11-30: qty 30, 30d supply, fill #3
  Filled 2023-12-30: qty 30, 30d supply, fill #4
  Filled 2024-01-25: qty 30, 30d supply, fill #5

## 2023-09-29 ENCOUNTER — Other Ambulatory Visit (HOSPITAL_BASED_OUTPATIENT_CLINIC_OR_DEPARTMENT_OTHER): Payer: Self-pay

## 2023-09-29 ENCOUNTER — Encounter: Payer: Self-pay | Admitting: Nurse Practitioner

## 2023-09-29 ENCOUNTER — Ambulatory Visit: Payer: PRIVATE HEALTH INSURANCE | Admitting: Nurse Practitioner

## 2023-09-29 VITALS — BP 136/88 | HR 73

## 2023-09-29 DIAGNOSIS — M7661 Achilles tendinitis, right leg: Secondary | ICD-10-CM

## 2023-09-29 MED ORDER — NAPROXEN 250 MG PO TABS
250.0000 mg | ORAL_TABLET | Freq: Two times a day (BID) | ORAL | 0 refills | Status: AC
  Filled 2023-09-29 (×2): qty 14, 7d supply, fill #0

## 2023-09-29 NOTE — Progress Notes (Signed)
 Acute Office Visit  Subjective:     Patient ID: Deborah Martin, female    DOB: 13-Sep-1965, 58 y.o.   MRN: [id number]  Chief Complaint  Patient presents with   Ankle Pain    HPI Patient is in today for right ankle pain. Denies recent trauma/ injury.  Reports ankle started hurting 2-3 weeks ago. States she was boxing in her garage, jumping on concrete floor. Noticed ankle started hurting the next day. Treatments at home: Ice and has taking ibuprofen - 800mg  over the past 2 days.  Pain is present in right achilles area and moves up lower leg. Desrcibes pain as soreness. Swelling is present. Reports ankle is not tender to touch. Can bear weight to right foot/leg. She reports pain is not bad in the morning but worsens as the day goes on.   Review of Systems  Musculoskeletal:  Negative for falls.  Neurological:  Negative for weakness.        Objective:    BP 136/88 (BP Location: Left Arm, Patient Position: Sitting, Cuff Size: Normal)   Pulse 73   LMP 01/09/2016 (Approximate)   SpO2 97%    Physical Exam Constitutional:      General: She is not in acute distress. Pulmonary:     Effort: Pulmonary effort is normal.  Musculoskeletal:        General: No deformity.     Right lower leg: No edema.     Left lower leg: No edema.     Right ankle: Swelling present. No deformity or ecchymosis. Normal range of motion.     Left ankle: No swelling or deformity.  Neurological:     Mental Status: She is alert.     No results found for any visits on 09/29/23.      Assessment & Plan:   Problem List Items Addressed This Visit   None Visit Diagnoses       Right Achilles tendinitis    -  Primary   Relevant Medications   naproxen  (NAPROSYN ) 250 MG tablet     Suspect achilles tendinitis, recommended supportive care measures including Rest, ice, compression, elevation and NSAIDS over the next week. If no improvement or if symptoms worsen, instructed to follow-up with PCP for further  eval and treatment. Discussed importance of taking NSAIDS with meals and monitoring for signs of GI bleeding. Reviewed when to seek care.   Meds ordered this encounter  Medications   naproxen  (NAPROSYN ) 250 MG tablet    Sig: Take 1 tablet (250 mg total) by mouth 2 (two) times daily with a meal for 7 days.    Dispense:  14 tablet    Refill:  0    Supervising Provider:   BACIGALUPO, ANGELA M [[id number]]   As needed.   Jaleah Lefevre Flores Skilynn Durney, NP

## 2023-10-02 ENCOUNTER — Other Ambulatory Visit (HOSPITAL_COMMUNITY): Payer: Self-pay

## 2023-10-05 ENCOUNTER — Other Ambulatory Visit: Payer: Self-pay

## 2023-10-05 ENCOUNTER — Other Ambulatory Visit (HOSPITAL_COMMUNITY): Payer: Self-pay

## 2023-10-05 MED ORDER — ZEPBOUND 2.5 MG/0.5ML ~~LOC~~ SOAJ
2.5000 mg | SUBCUTANEOUS | 1 refills | Status: AC
  Filled 2023-10-05 – 2023-10-06 (×2): qty 2, 28d supply, fill #0

## 2023-10-06 ENCOUNTER — Other Ambulatory Visit (HOSPITAL_COMMUNITY): Payer: Self-pay

## 2023-10-23 ENCOUNTER — Other Ambulatory Visit (HOSPITAL_COMMUNITY): Payer: Self-pay

## 2023-10-23 MED ORDER — NAPROXEN 500 MG PO TABS
500.0000 mg | ORAL_TABLET | Freq: Two times a day (BID) | ORAL | 0 refills | Status: DC
  Filled 2023-10-23: qty 60, 30d supply, fill #0

## 2023-11-06 ENCOUNTER — Other Ambulatory Visit: Payer: Self-pay

## 2023-11-06 ENCOUNTER — Other Ambulatory Visit (HOSPITAL_COMMUNITY): Payer: Self-pay

## 2023-11-10 ENCOUNTER — Encounter: Payer: Self-pay | Admitting: Internal Medicine

## 2024-01-05 ENCOUNTER — Other Ambulatory Visit (HOSPITAL_COMMUNITY): Payer: Self-pay

## 2024-01-05 ENCOUNTER — Encounter: Payer: Self-pay | Admitting: Family Medicine

## 2024-01-05 ENCOUNTER — Ambulatory Visit: Payer: PRIVATE HEALTH INSURANCE | Admitting: Family Medicine

## 2024-01-05 ENCOUNTER — Other Ambulatory Visit: Payer: Self-pay

## 2024-01-05 VITALS — BP 104/60 | HR 91 | Ht 64.0 in | Wt 198.0 lb

## 2024-01-05 DIAGNOSIS — M25571 Pain in right ankle and joints of right foot: Secondary | ICD-10-CM | POA: Diagnosis not present

## 2024-01-05 DIAGNOSIS — S86311A Strain of muscle(s) and tendon(s) of peroneal muscle group at lower leg level, right leg, initial encounter: Secondary | ICD-10-CM | POA: Diagnosis not present

## 2024-01-05 DIAGNOSIS — G8929 Other chronic pain: Secondary | ICD-10-CM | POA: Diagnosis not present

## 2024-01-05 MED ORDER — NITROGLYCERIN 0.2 MG/HR TD PT24
MEDICATED_PATCH | TRANSDERMAL | 0 refills | Status: DC
  Filled 2024-01-05: qty 30, 30d supply, fill #0

## 2024-01-05 NOTE — Assessment & Plan Note (Addendum)
 Patient given injection and tolerated the procedure well, discussed icing regimen and home exercises, discussed which activities to do which ones to avoid.  Increase activity slowly.  Follow-up again in 6 to 12 weeks.  Started on nitroglycerin patches as well, warned of potential side effects.  Do think that this could aid in the help of the healing.

## 2024-01-05 NOTE — Patient Instructions (Addendum)
 Good to see you.  Nitroglycerin Protocol   Apply 1/4 nitroglycerin patch to affected area daily.  Change position of patch within the affected area every 24 hours.  You may experience a headache during the first 1-2 weeks of using the patch, these should subside.  If you experience headaches after beginning nitroglycerin patch treatment, you may take your preferred over the counter pain reliever.  Another side effect of the nitroglycerin patch is skin irritation or rash related to patch adhesive.  Please notify our office if you develop more severe headaches or rash, and stop the patch.  Tendon healing with nitroglycerin patch may require 12 to 24 weeks depending on the extent of injury.  Men should not use if taking Viagra, Cialis, or Levitra.   Do not use if you have migraines or rosacea.  [place] recovery sandals Do prescribed exercises at least 3x a week Heel lifts See you again in 6-8 week

## 2024-01-05 NOTE — Progress Notes (Signed)
 [name] [address] [place] [address] Phone: [phone] Subjective:   Deborah Martin am a scribe for Dr. Claudene.   I'm seeing this patient by the request  of:  Marylynn Verneita CROME, MD  CC: Right ankle pain  YEP:Dlagzrupcz  Deborah Martin is a 58 y.o. female coming in with complaint of R ankle pain. Patient states that it happened in July but went to [facility] in August and he said it was a sprain. Was given a brace that she has been wearing. It is still swelling and hurting. Just here to check and make sure that there isn't anything else going on with it.      Was seen by an outside provider for ankle pain, diagnosed with more of a Achilles tendinosis and then peroneal tendinitis.  Past Medical History:  Diagnosis Date   Anxiety    Arthritis    lower back   Cancer (HCC)    Melanoma   Restless legs    Past Surgical History:  Procedure Laterality Date   ABDOMINAL HYSTERECTOMY     CESAREAN SECTION     EXCISION MELANOMA WITH SENTINEL LYMPH NODE BIOPSY Right 04/15/2021   Procedure: WIDE LOCAL EXCISION OF RIGHT BUTTOCK MELANOMA WITH SENTINEL LYMPH NODE BIOPSY;  Surgeon: Dasie Leonor CROME, MD;  Location: [facility];  Service: General;  Laterality: Right;  LMA   LAPAROSCOPIC VAGINAL HYSTERECTOMY WITH SALPINGO OOPHORECTOMY Bilateral 03/06/2016   Procedure: LAPAROSCOPIC ASSISTED VAGINAL HYSTERECTOMY WITH BILATERAL SALPINGO OOPHORECTOMY;  Surgeon: Lynwood Clubs, MD;  Location: [facility];  Service: Gynecology;  Laterality: Bilateral;   TONSILLECTOMY     Social History   Socioeconomic History   Marital status: Married    Spouse name: Not on file   Number of children: Not on file   Years of education: Not on file   Highest education level: Bachelor's degree (e.g., BA, AB, BS)  Occupational History   Not on file  Tobacco Use   Smoking status: Never   Smokeless tobacco: Never  Vaping Use   Vaping status: Never Used  Substance and Sexual  Activity   Alcohol use: Yes    Comment: occ   Drug use: No   Sexual activity: Not on file  Other Topics Concern   Not on file  Social History Narrative   Not on file   Social Drivers of Health   Financial Resource Strain: Low Risk  (05/21/2023)   Overall Financial Resource Strain (CARDIA)    Difficulty of Paying Living Expenses: Not hard at all  Food Insecurity: No Food Insecurity (05/21/2023)   Hunger Vital Sign    Worried About Running Out of Food in the Last Year: Never true    Ran Out of Food in the Last Year: Never true  Transportation Needs: No Transportation Needs (05/21/2023)   PRAPARE - Administrator, Civil Service (Medical): No    Lack of Transportation (Non-Medical): No  Physical Activity: Insufficiently Active (05/21/2023)   Exercise Vital Sign    Days of Exercise per Week: 2 days    Minutes of Exercise per Session: 40 min  Stress: No Stress Concern Present (05/21/2023)   [employer] of Occupational Health - Occupational Stress Questionnaire    Feeling of Stress : Only a little  Social Connections: Socially Integrated (05/21/2023)   Social Connection and Isolation Panel    Frequency of Communication with Friends and Family: More than three times a week    Frequency  of Social Gatherings with Friends and Family: Twice a week    Attends Religious Services: More than 4 times per year    Active Member of [employer] or Organizations: Yes    Attends Engineer, Structural: More than 4 times per year    Marital Status: Married   No Known Allergies Family History  Problem Relation Age of Onset   Cancer Mother 9       ovarian CA died at 56   Cancer Brother 74       melanoma    Current Outpatient Medications (Endocrine & Metabolic):    estradiol  (ESTRACE ) 1 MG tablet, Take 1 tablet (1 mg total) by mouth daily.  Current Outpatient Medications (Cardiovascular):    nitroGLYCERIN (NITRO-DUR) 0.2 mg/hr patch, Apply 1/4 of a patch to skin once  daily.  Current Outpatient Medications (Respiratory):    cetirizine (ZYRTEC) 10 MG tablet, Take 10 mg by mouth daily as needed for allergies.   fluticasone  (FLONASE ) 50 MCG/ACT nasal spray, Place 2 sprays into both nostrils daily.  Current Outpatient Medications (Analgesics):    naproxen  (NAPROSYN ) 500 MG tablet, Take 1 tablet (500 mg total) by mouth 2 (two) times daily.   Current Outpatient Medications (Other):    escitalopram  (LEXAPRO ) 10 MG tablet, Take 1 tablet (10 mg total) by mouth daily.   metroNIDAZOLE  (METROCREAM ) 0.75 % cream, Apply a dime-sized amount topically to face twice a day and taper use to daily once improved.   Multiple Vitamins-Minerals (MULTIVITAMIN WITH MINERALS) tablet, Take 1 tablet by mouth daily.   pramipexole  (MIRAPEX ) 0.5 MG tablet, Take 1 tablet (0.5 mg total) by mouth at bedtime.   tirzepatide  (ZEPBOUND ) 2.5 MG/0.5ML Pen, Inject 2.5 mg into the skin once a week.   zolpidem  (AMBIEN  CR) 6.25 MG CR tablet, Take 1 tablet (6.25 mg total) by mouth at bedtime.   zolpidem  (AMBIEN  CR) 6.25 MG CR tablet, Take 1 tablet (6.25 mg total) by mouth at bedtime as needed.   Reviewed prior external information including notes and imaging from  primary care provider As well as notes that were available from care everywhere and other healthcare systems.  Past medical history, social, surgical and family history all reviewed in electronic medical record.  No pertanent information unless stated regarding to the chief complaint.   Review of Systems:  No headache, visual changes, nausea, vomiting, diarrhea, constipation, dizziness, abdominal pain, skin rash, fevers, chills, night sweats, weight loss, swollen lymph nodes, body aches, joint swelling, chest pain, shortness of breath, mood changes. POSITIVE muscle aches  Objective  Blood pressure 104/60, pulse 91, height 5' 4 (1.626 m), weight 198 lb (89.8 kg), last menstrual period 01/09/2016, SpO2 97%.   General: No apparent  distress alert and oriented x3 mood and affect normal, dressed appropriately.  HEENT: Pupils equal, extraocular movements intact  Respiratory: Patient's speak in full sentences and does not appear short of breath  Cardiovascular: No lower extremity edema, non tender, no erythema. Right ankle does have significant swelling over the peroneal tendon.  Tenderness to palpation just inferior and posterior to the lateral malleolus.  Patient does have swelling on the anterior aspect of the lateral malleolus as well though.  Neurovascular intact distally.  Some limited range of motion noted.  Limited muscular skeletal ultrasound was performed and interpreted by CLAUDENE HUSSAR, M  Limited ultrasound of patient's right ankle shows hypoechoic changes within the tendon sheath consistent with effusion in the peroneal tendon.  Also has an area that seems to be  a split tear noted in the peroneal brevis.  This is just inferior and posterior actually to the lateral malleolus.  Some mild increase in Doppler flow noted. Impression: Peroneal tendon tear with hypoechoic changes  Procedure: Real-time Ultrasound Guided Injection of right peroneal tendon sheath Device: GE Logiq Q7 Ultrasound guided injection is preferred based studies that show increased duration, increased effect, greater accuracy, decreased procedural pain, increased response rate, and decreased cost with ultrasound guided versus blind injection.  Verbal informed consent obtained.  Time-out conducted.  Noted no overlying erythema, induration, or other signs of local infection.  Skin prepped in a sterile fashion.  Local anesthesia: Topical Ethyl chloride.  With sterile technique and under real time ultrasound guidance: With a 25-gauge half inch needle injected with 0.5 cc 0.5% Marcaine  and 0.5 cc of Kenalog 40 mg/mL Completed without difficulty  Pain immediately resolved suggesting accurate placement of the medication.  Advised to call if  fevers/chills, erythema, induration, drainage, or persistent bleeding.  Impression: Technically successful ultrasound guided injection.    Impression and Recommendations:     The above documentation has been reviewed and is accurate and complete Deborah Martin M Robertlee Rogacki, DO

## 2024-01-07 ENCOUNTER — Encounter: Payer: Self-pay | Admitting: Family Medicine

## 2024-02-15 NOTE — Progress Notes (Signed)
 " [name] [address] [place] [address] Phone: [phone] Subjective:   Deborah Martin, am serving as a scribe for Dr. Arthea Claudene.  I'm seeing this patient by the request  of:  Marylynn Verneita CROME, MD  CC: right ankle pain   YEP:Dlagzrupcz  01/05/2024 Patient given injection and tolerated the procedure well, discussed icing regimen and home exercises, discussed which activities to do which ones to avoid.  Increase activity slowly.  Follow-up again in 6 to 12 weeks.  Started on nitroglycerin  patches as well, warned of potential side effects.  Do think that this could aid in the help of the healing.     Update 02/17/2024 Deborah Martin is a 58 y.o. female coming in with complaint of R ankle pain. Prescribed nitroglycerin  patches.  Injection in the peroneal tendon sheath as well in November.  Patient states that she is doing a lot better. Not having any pain.        Past Medical History:  Diagnosis Date   Anxiety    Arthritis    lower back   Cancer (HCC)    Melanoma   Restless legs    Past Surgical History:  Procedure Laterality Date   ABDOMINAL HYSTERECTOMY     CESAREAN SECTION     EXCISION MELANOMA WITH SENTINEL LYMPH NODE BIOPSY Right 04/15/2021   Procedure: WIDE LOCAL EXCISION OF RIGHT BUTTOCK MELANOMA WITH SENTINEL LYMPH NODE BIOPSY;  Surgeon: Dasie Leonor CROME, MD;  Location: [facility];  Service: General;  Laterality: Right;  LMA   LAPAROSCOPIC VAGINAL HYSTERECTOMY WITH SALPINGO OOPHORECTOMY Bilateral 03/06/2016   Procedure: LAPAROSCOPIC ASSISTED VAGINAL HYSTERECTOMY WITH BILATERAL SALPINGO OOPHORECTOMY;  Surgeon: Lynwood Clubs, MD;  Location: [facility];  Service: Gynecology;  Laterality: Bilateral;   TONSILLECTOMY     Social History   Socioeconomic History   Marital status: Married    Spouse name: Not on file   Number of children: Not on file   Years of education: Not on file   Highest education level: Bachelor's degree (e.g.,  BA, AB, BS)  Occupational History   Not on file  Tobacco Use   Smoking status: Never   Smokeless tobacco: Never  Vaping Use   Vaping status: Never Used  Substance and Sexual Activity   Alcohol use: Yes    Comment: occ   Drug use: No   Sexual activity: Not on file  Other Topics Concern   Not on file  Social History Narrative   Not on file   Social Drivers of Health   Tobacco Use: Low Risk (01/05/2024)   Patient History    Smoking Tobacco Use: Never    Smokeless Tobacco Use: Never    Passive Exposure: Not on file  Financial Resource Strain: Low Risk (05/21/2023)   Overall Financial Resource Strain (CARDIA)    Difficulty of Paying Living Expenses: Not hard at all  Food Insecurity: No Food Insecurity (05/21/2023)   Hunger Vital Sign    Worried About Running Out of Food in the Last Year: Never true    Ran Out of Food in the Last Year: Never true  Transportation Needs: No Transportation Needs (05/21/2023)   PRAPARE - Administrator, Civil Service (Medical): No    Lack of Transportation (Non-Medical): No  Physical Activity: Insufficiently Active (05/21/2023)   Exercise Vital Sign    Days of Exercise per Week: 2 days    Minutes of Exercise per Session: 40 min  Stress:  No Stress Concern Present (05/21/2023)   [employer] of Occupational Health - Occupational Stress Questionnaire    Feeling of Stress : Only a little  Social Connections: Socially Integrated (05/21/2023)   Social Connection and Isolation Panel    Frequency of Communication with Friends and Family: More than three times a week    Frequency of Social Gatherings with Friends and Family: Twice a week    Attends Religious Services: More than 4 times per year    Active Member of Clubs or Organizations: Yes    Attends Banker Meetings: More than 4 times per year    Marital Status: Married  Depression (PHQ2-9): Low Risk (07/17/2022)   Depression (PHQ2-9)    PHQ-2 Score: 4  Alcohol Screen: Low  Risk (05/21/2023)   Alcohol Screen    Last Alcohol Screening Score (AUDIT): 2  Housing: Unknown (07/22/2023)   Received from [facility]   Epic    At any time in the past 12 months, were you homeless or living in a shelter (including now)?: No    Number of Times Moved in the Last Year: Not on file    Unable to Pay for Housing in the Last Year: Not on file  Utilities: Not on file  Health Literacy: Not on file   Allergies[1] Family History  Problem Relation Age of Onset   Cancer Mother 83       ovarian CA died at 54   Cancer Brother 32       melanoma    Current Outpatient Medications (Endocrine & Metabolic):    estradiol  (ESTRACE ) 1 MG tablet, Take 1 tablet (1 mg total) by mouth daily.  Current Outpatient Medications (Cardiovascular):    nitroGLYCERIN  (NITRO-DUR ) 0.2 mg/hr patch, Apply 1/4 of a patch to skin once daily.  Current Outpatient Medications (Respiratory):    cetirizine (ZYRTEC) 10 MG tablet, Take 10 mg by mouth daily as needed for allergies.   fluticasone  (FLONASE ) 50 MCG/ACT nasal spray, Place 2 sprays into both nostrils daily.  Current Outpatient Medications (Analgesics):    naproxen  (NAPROSYN ) 500 MG tablet, Take 1 tablet (500 mg total) by mouth 2 (two) times daily.  Current Outpatient Medications (Other):    escitalopram  (LEXAPRO ) 10 MG tablet, Take 1 tablet (10 mg total) by mouth daily.   metroNIDAZOLE  (METROCREAM ) 0.75 % cream, Apply a dime-sized amount topically to face twice a day and taper use to daily once improved.   Multiple Vitamins-Minerals (MULTIVITAMIN WITH MINERALS) tablet, Take 1 tablet by mouth daily.   pramipexole  (MIRAPEX ) 0.5 MG tablet, Take 1 tablet (0.5 mg total) by mouth at bedtime.   tirzepatide  (ZEPBOUND ) 2.5 MG/0.5ML Pen, Inject 2.5 mg into the skin once a week.   zolpidem  (AMBIEN  CR) 6.25 MG CR tablet, Take 1 tablet (6.25 mg total) by mouth at bedtime.   zolpidem  (AMBIEN  CR) 6.25 MG CR tablet, Take 1 tablet (6.25 mg total)  by mouth at bedtime as needed.   Reviewed prior external information including notes and imaging from  primary care provider As well as notes that were available from care everywhere and other healthcare systems.  Past medical history, social, surgical and family history all reviewed in electronic medical record.  No pertanent information unless stated regarding to the chief complaint.   Review of Systems:  No headache, visual changes, nausea, vomiting, diarrhea, constipation, dizziness, abdominal pain, skin rash, fevers, chills, night sweats, weight loss, swollen lymph nodes, body aches, joint swelling, chest pain, shortness of breath,  mood changes. POSITIVE muscle aches  Objective  Blood pressure 110/62, pulse 69, height 5' 4 (1.626 m), weight 192 lb (87.1 kg), last menstrual period 01/09/2016, SpO2 99%.   General: No apparent distress alert and oriented x3 mood and affect normal, dressed appropriately.  HEENT: Pupils equal, extraocular movements intact  Respiratory: Patient's speak in full sentences and does not appear short of breath  Cardiovascular: No lower extremity edema, non tender, no erythema  Ankle exam shows patient is no longer tender on exam.  Good strength in all areas.  Limited muscular skeletal ultrasound was performed and interpreted by CLAUDENE HUSSAR, M  Limited ultrasound shows significant decrease in hypoechoic changes with near tendon formation noted.  Decrease in Doppler flow and some mild increase in neovascularization.  This is all of the peroneal tendon. Impression: Interval improvement    Impression and Recommendations:    The above documentation has been reviewed and is accurate and complete Tatsuo Musial M Maelee Hoot, DO        [1] No Known Allergies  "

## 2024-02-17 ENCOUNTER — Ambulatory Visit: Payer: PRIVATE HEALTH INSURANCE | Admitting: Family Medicine

## 2024-02-17 ENCOUNTER — Encounter: Payer: Self-pay | Admitting: Family Medicine

## 2024-02-17 ENCOUNTER — Other Ambulatory Visit: Payer: Self-pay

## 2024-02-17 VITALS — BP 110/62 | HR 69 | Ht 64.0 in | Wt 192.0 lb

## 2024-02-17 DIAGNOSIS — M25571 Pain in right ankle and joints of right foot: Secondary | ICD-10-CM

## 2024-02-17 DIAGNOSIS — S86311A Strain of muscle(s) and tendon(s) of peroneal muscle group at lower leg level, right leg, initial encounter: Secondary | ICD-10-CM

## 2024-02-17 NOTE — Patient Instructions (Signed)
 Happy Holidays Body Helix brace Nitroglycerin  for another 3 weeks Be cautious until 1st of the year See me again in 2 months

## 2024-02-17 NOTE — Assessment & Plan Note (Signed)
 Significant improvement noted.  Significant decrease in the hypoechoic changes.  This is of both the peroneal and the posterior tibialis.  We discussed with patient about icing regimen and home exercises, discussed which activities to do, discussed compression with her starting to increase activity.  Discontinue nitroglycerin  within the next month.  Follow-up with me again in 2 months

## 2024-02-24 ENCOUNTER — Other Ambulatory Visit (HOSPITAL_COMMUNITY): Payer: Self-pay

## 2024-02-24 ENCOUNTER — Other Ambulatory Visit: Payer: Self-pay

## 2024-02-24 ENCOUNTER — Other Ambulatory Visit: Payer: Self-pay | Admitting: Internal Medicine

## 2024-02-24 MED ORDER — PRAMIPEXOLE DIHYDROCHLORIDE 0.5 MG PO TABS
0.5000 mg | ORAL_TABLET | Freq: Every evening | ORAL | 1 refills | Status: AC
  Filled 2024-02-24: qty 30, 30d supply, fill #0
  Filled 2024-03-23: qty 30, 30d supply, fill #1

## 2024-03-22 ENCOUNTER — Encounter: Payer: Self-pay | Admitting: Nurse Practitioner

## 2024-03-22 ENCOUNTER — Ambulatory Visit: Payer: PRIVATE HEALTH INSURANCE | Admitting: Nurse Practitioner

## 2024-03-22 VITALS — BP 120/78 | HR 71 | Temp 97.1°F

## 2024-03-22 DIAGNOSIS — L508 Other urticaria: Secondary | ICD-10-CM

## 2024-03-22 NOTE — Progress Notes (Signed)
 [facility]  Acute Office Visit  Subjective:     Patient ID: Deborah Martin, female    DOB: [birth year], 59 y.o.   MRN: [id number]  Chief Complaint  Patient presents with   Urticaria    HPI Patient is in today for Hives located on trunk and bilateral arms.  Started mid-day yesertday, she took benadryl  which helped symptoms some. She also had Benadryl  this morning but reports itching as returned.   Denies hives on face, no swelling of throat. Denies shortness of breath.  Denies new changes in soap, detergent or medications.  Denies trying any new foods. She admits to be under a lot of stress at work lately. She has been working increased hours.   Review of Systems  Constitutional:  Positive for chills. Negative for fever.  Skin:  Positive for itching and rash.        Objective:    BP 120/78 (BP Location: Left Arm, Patient Position: Sitting, Cuff Size: Normal)   Pulse 71   Temp (!) 97.1 F (36.2 C)   LMP 01/09/2016   SpO2 96%    Physical Exam Constitutional:      General: She is not in acute distress. Pulmonary:     Effort: Pulmonary effort is normal. No respiratory distress.  Skin:    General: Skin is warm.     Findings: Rash present. Rash is urticarial.     Comments: Red, itchy welts, large patches located on abdomen, smaller patches loctaed on bilateral upper arms and for arm.   Neurological:     General: No focal deficit present.     Mental Status: She is alert and oriented to person, place, and time.     No results found for any visits on 03/22/24.      Assessment & Plan:   Problem List Items Addressed This Visit   None Visit Diagnoses       Urticaria, acute    -  Primary     Trigger unclear at this time, although may be stress induced. Start Claritin or Zyrtec daily, discussed supportive care measures including avoiding hot showers/baths and avoid scratching the affected areas. Can try cold compresses to alleviate itching. Can  consider short course of steroids if antihistamine is ineffective.  Discussed red flag symptoms and when to seek care.    No orders of the defined types were placed in this encounter.   Will follow-up with patient when I return to clinic in 2 days or patient can follow-up with PCP if symptoms fail to improve.   Zaul Hubers Flores Thresea Doble, NP

## 2024-03-23 ENCOUNTER — Ambulatory Visit: Payer: PRIVATE HEALTH INSURANCE | Admitting: Family Medicine

## 2024-03-23 ENCOUNTER — Encounter: Payer: Self-pay | Admitting: Family Medicine

## 2024-03-23 VITALS — BP 110/70 | HR 72 | Temp 97.7°F | Ht 64.0 in | Wt 193.0 lb

## 2024-03-23 DIAGNOSIS — L501 Idiopathic urticaria: Secondary | ICD-10-CM

## 2024-03-23 MED ORDER — METHYLPREDNISOLONE ACETATE 80 MG/ML IJ SUSP
80.0000 mg | Freq: Once | INTRAMUSCULAR | Status: AC
  Administered 2024-03-23: 80 mg via INTRAMUSCULAR

## 2024-03-23 NOTE — Progress Notes (Signed)
" ° °  Deborah Martin is a 59 y.o. female who presents today for an office visit.  Assessment/Plan:  Idiopathic Urticaria  No red flags.  Patient with 2 to 3 days of generalized urticaria without obvious trigger.  She has had some response to Benadryl  however urticaria returns after Benadryl  wears off.  We discussed management options.  Will give 80 mg of Depo-Medrol  today and recommended double dose of over-the-counter second-generation antihistamine such as Zyrtec, Claritin, or Allegra.  We did discuss additional workup including labs, referral for allergy testing however we will hold off on this for now.  She will follow-up with me or her PCP if symptoms do not resolve with above or if she has any recurrent symptoms and would consider referral to allergist at that time.     Subjective:  HPI:  See assessment / plan for status of chronic conditions.   Discussed the use of AI scribe software for clinical note transcription with the patient, who gave verbal consent to proceed.  History of Present Illness JOYCELIN Martin is a 59 year old female who presents with a widespread urticarial rash.  She experienced the sudden onset of hives approximately three days ago. The rash initially appeared on her torso and chest, and has since spread to her feet, legs, neck, and face. It turns 'blood red' when untreated and causes significant itching, particularly at night, disrupting her sleep.  She has attempted treatment with Claritin, which was ineffective, and Benadryl , which provided temporary relief by lightening the rash. The rash reappears and intensifies as the Benadryl  wears off.  No recent changes in soaps, detergents, or lotions, and no new medications or supplements. She used an old box of electrolyte powder in her coffee recently but has used it many times before without issue. No respiratory symptoms such as wheezing, cough, or shortness of breath, and no recent illnesses.  She suspects stress  may be a contributing factor, as she has been under significant work-related stress, performing multiple roles for the past six weeks. She has not identified any specific triggers for the rash, although she recently wore new sweaters without washing them first.         Objective:  Physical Exam: BP 110/70   Pulse 72   Temp 97.7 F (36.5 C) (Temporal)   Ht 5' 4 (1.626 m)   Wt 193 lb (87.5 kg)   LMP 01/09/2016   SpO2 98%   BMI 33.13 kg/m   Gen: No acute distress, resting comfortably CV: Regular rate and rhythm with no murmurs appreciated Pulm: Normal work of breathing, clear to auscultation bilaterally with no crackles, wheezes, or rhonchi Skin: Erythematous urticarial rash along torso and extremities bilaterally. Neuro: Grossly normal, moves all extremities Psych: Normal affect and thought content      Deborah Andrew M. Kennyth, MD 03/23/2024 8:19 AM  "

## 2024-03-23 NOTE — Patient Instructions (Signed)
 It was very nice to see you today!  VISIT SUMMARY: During your visit, we discussed your recent onset of a widespread urticarial rash that has been causing significant itching and discomfort. You received a steroid injection for rapid relief and were given a treatment plan to manage your symptoms.  YOUR PLAN: ACUTE URTICARIA: You have a widespread rash that causes significant itching and discomfort. It is suspected to be idiopathic urticaria, meaning the exact cause is unknown. -You received a steroid injection for rapid symptom relief. -Take Zyrtec 10 mg every 12 hours for two weeks. -Consider allergy testing and blood work if the rash recurs. -Follow up with your primary care physician if symptoms persist or recur.  Return if symptoms worsen or fail to improve.   Take care, Dr Kennyth  PLEASE NOTE:  If you had any lab tests, please let us  know if you have not heard back within a few days. You may see your results on mychart before we have a chance to review them but we will give you a call once they are reviewed by us .   If we ordered any referrals today, please let us  know if you have not heard from their office within the next week.   If you had any urgent prescriptions sent in today, please check with the pharmacy within an hour of our visit to make sure the prescription was transmitted appropriately.   Please try these tips to maintain a healthy lifestyle:  Eat at least 3 REAL meals and 1-2 snacks per day.  Aim for no more than 5 hours between eating.  If you eat breakfast, please do so within one hour of getting up.   Each meal should contain half fruits/vegetables, one quarter protein, and one quarter carbs (no bigger than a computer mouse)  Cut down on sweet beverages. This includes juice, soda, and sweet tea.   Drink at least 1 glass of water with each meal and aim for at least 8 glasses per day  Exercise at least 150 minutes every week.

## 2024-04-20 ENCOUNTER — Ambulatory Visit: Payer: PRIVATE HEALTH INSURANCE | Admitting: Family Medicine
